# Patient Record
Sex: Female | Born: 1990
Health system: Southern US, Community
[De-identification: ages and names within clinical notes are randomized; demographics above are authoritative.]

## PROBLEM LIST (undated history)

## (undated) DIAGNOSIS — R51 Headache: Secondary | ICD-10-CM

## (undated) DIAGNOSIS — Z98891 History of uterine scar from previous surgery: Secondary | ICD-10-CM

## (undated) DIAGNOSIS — R519 Headache, unspecified: Secondary | ICD-10-CM

## (undated) DIAGNOSIS — Z789 Other specified health status: Secondary | ICD-10-CM

---
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
## 2009-05-10 DIAGNOSIS — Z98891 History of uterine scar from previous surgery: Secondary | ICD-10-CM

## 2009-05-10 HISTORY — DX: History of uterine scar from previous surgery: Z98.891

## 2013-03-06 LAB — OB RESULTS CONSOLE HIV ANTIBODY (ROUTINE TESTING): HIV: NONREACTIVE

## 2013-03-06 LAB — OB RESULTS CONSOLE RUBELLA ANTIBODY, IGM: RUBELLA: IMMUNE

## 2013-03-06 LAB — OB RESULTS CONSOLE RPR: RPR: NONREACTIVE

## 2013-03-06 LAB — OB RESULTS CONSOLE ABO/RH: RH TYPE: NEGATIVE

## 2013-03-06 LAB — OB RESULTS CONSOLE HEPATITIS B SURFACE ANTIGEN: HEP B S AG: NEGATIVE

## 2013-03-06 LAB — OB RESULTS CONSOLE ANTIBODY SCREEN: ANTIBODY SCREEN: NEGATIVE

## 2013-03-19 ENCOUNTER — Encounter: Payer: Self-pay | Admitting: Family Medicine

## 2013-08-16 ENCOUNTER — Other Ambulatory Visit: Payer: Self-pay | Admitting: Obstetrics and Gynecology

## 2013-09-07 LAB — OB RESULTS CONSOLE GC/CHLAMYDIA
Chlamydia: NEGATIVE
Gonorrhea: NEGATIVE

## 2013-09-24 ENCOUNTER — Encounter (HOSPITAL_COMMUNITY): Payer: Self-pay | Admitting: Pharmacist

## 2013-10-03 ENCOUNTER — Encounter (HOSPITAL_COMMUNITY): Payer: Self-pay

## 2013-10-04 ENCOUNTER — Encounter (HOSPITAL_COMMUNITY): Payer: Self-pay

## 2013-10-04 ENCOUNTER — Encounter (HOSPITAL_COMMUNITY)
Admission: RE | Admit: 2013-10-04 | Discharge: 2013-10-04 | Disposition: A | Discharge status: Home / Self Care | Payer: 59 | Source: Ambulatory Visit | Attending: Obstetrics and Gynecology | Admitting: Obstetrics and Gynecology

## 2013-10-04 LAB — CBC
HEMATOCRIT: 35.4 % — AB (ref 36.0–46.0)
Hemoglobin: 11.3 g/dL — ABNORMAL LOW (ref 12.0–15.0)
MCH: 27.1 pg (ref 26.0–34.0)
MCHC: 31.9 g/dL (ref 30.0–36.0)
MCV: 84.9 fL (ref 78.0–100.0)
PLATELETS: 239 10*3/uL (ref 150–400)
RBC: 4.17 MIL/uL (ref 3.87–5.11)
RDW: 16.2 % — AB (ref 11.5–15.5)
WBC: 10.6 10*3/uL — AB (ref 4.0–10.5)

## 2013-10-04 LAB — RPR

## 2013-10-04 NOTE — Patient Instructions (Signed)
   Your procedure is scheduled on:  Friday MAY 29 AT 1PM  Enter through the Main Entrance of [facility] at:1130 Pick up the phone at the desk and dial [phone] and inform us of your arrival.  Please call this number if you have any problems the morning of surgery: [phone]  Remember: Do not eat food after midnight: Do not drink clear liquids after: YOU CAN HAVE CLEAR LIQUIDS TIL 9AM Take these medicines the morning of surgery with a SIP OF WATER:  Do not wear jewelry, make-up, or FINGER nail polish No metal in your hair or on your body. Do not wear lotions, powders, perfumes.  You may wear deodorant.  Do not bring valuables to the hospital. Contacts, dentures or bridgework may not be worn into surgery.  Leave suitcase in the car. After Surgery it may be brought to your room. For patients being admitted to the hospital, checkout time is 11:00am the day of discharge.    Patients discharged on the day of surgery will not be allowed to drive home.

## 2013-10-04 NOTE — H&P (Signed)
Pamela Mcintosh, Pamela Mcintosh               ACCOUNT NO.:  [id number]  MEDICAL RECORD NO.:  [id number]  LOCATION:                                 FACILITY:  PHYSICIAN:  Lenoard Aden, M.D.DATE OF BIRTH:  November 06, 1990  DATE OF ADMISSION:  10/05/2013 DATE OF DISCHARGE:                             HISTORY & PHYSICAL   CHIEF COMPLAINT:  Repeat C-section at 39 weeks.  HISTORY OF PRESENT ILLNESS:  A 23 year old white female, G3, P1, for repeat C-section at 39 weeks.  ALLERGIES:  NONE.  MEDICATIONS:  Nexium and prenatal vitamins.  FAMILY HISTORY:  Thyroid disease, diabetes, breast cancer, hypertension, kidney disease, and rheumatoid arthritis.  SURGICAL HISTORY:  History of C-section x1.  PHYSICAL EXAMINATION:  GENERAL:  Well-developed well-nourished white female in no acute distress. HEENT:  Normal. NECK:  Supple.  Full range of motion. LUNGS:  Clear. HEART:  Regular rate and rhythm. ABDOMEN:  Soft, gravid, nontender.  Estimated fetal weight 7.5 pounds. Cervix is closed, 50%, vertex, -2. EXTREMITIES:  There are no cords. NEUROLOGIC:  Nonfocal. SKIN:  Intact.  IMPRESSION:  Term intrauterine pregnancy at 39 weeks with previous C- section, for repeat.  PLAN:  Proceed with repeat low segment transverse cesarean section. Risks of anesthesia, infection, bleeding, injury to surrounding organs, need for repair were discussed.  Delayed versus immediate complications to include bowel and bladder injury noted.  Consents were signed.  The patient desires to proceed.     Lenoard Aden, M.D.    RJT/MEDQ  D:  10/04/2013  T:  10/04/2013  Job:  [id number]

## 2013-10-05 ENCOUNTER — Encounter (HOSPITAL_COMMUNITY)
Admission: AD | Disposition: A | Discharge status: Home / Self Care | Payer: Self-pay | Source: Ambulatory Visit | Attending: Obstetrics and Gynecology

## 2013-10-05 ENCOUNTER — Inpatient Hospital Stay (HOSPITAL_COMMUNITY)
Admission: AD | Admit: 2013-10-05 | Discharge: 2013-10-07 | DRG: 766 | Disposition: A | Discharge status: Home / Self Care | Payer: 59 | Source: Ambulatory Visit | Attending: Obstetrics and Gynecology | Admitting: Obstetrics and Gynecology

## 2013-10-05 ENCOUNTER — Inpatient Hospital Stay (HOSPITAL_COMMUNITY): Payer: 59 | Admitting: Anesthesiology

## 2013-10-05 ENCOUNTER — Encounter (HOSPITAL_COMMUNITY): Payer: Self-pay | Admitting: *Deleted

## 2013-10-05 ENCOUNTER — Encounter (HOSPITAL_COMMUNITY): Payer: 59 | Admitting: Anesthesiology

## 2013-10-05 DIAGNOSIS — Z803 Family history of malignant neoplasm of breast: Secondary | ICD-10-CM | POA: Diagnosis not present

## 2013-10-05 DIAGNOSIS — O34219 Maternal care for unspecified type scar from previous cesarean delivery: Secondary | ICD-10-CM | POA: Diagnosis present

## 2013-10-05 DIAGNOSIS — Z833 Family history of diabetes mellitus: Secondary | ICD-10-CM

## 2013-10-05 HISTORY — DX: History of uterine scar from previous surgery: Z98.891

## 2013-10-05 HISTORY — DX: Other specified health status: Z78.9

## 2013-10-05 SURGERY — Surgical Case
Anesthesia: Spinal | Site: Abdomen

## 2013-10-05 MED ORDER — PHENYLEPHRINE 8 MG IN D5W 100 ML (0.08MG/ML) PREMIX OPTIME
INJECTION | INTRAVENOUS | Status: AC
  Filled 2013-10-05: qty 100

## 2013-10-05 MED ORDER — METOCLOPRAMIDE HCL 5 MG/ML IJ SOLN: 10.0000 mg | Freq: Three times a day (TID) | INTRAMUSCULAR | Status: DC | PRN

## 2013-10-05 MED ORDER — LACTATED RINGERS IV SOLN
INTRAVENOUS | Status: DC | PRN
  Administered 2013-10-05: 13:00:00 via INTRAVENOUS

## 2013-10-05 MED ORDER — 0.9 % SODIUM CHLORIDE (POUR BTL) OPTIME
TOPICAL | Status: DC | PRN
  Administered 2013-10-05: 1000 mL

## 2013-10-05 MED ORDER — IBUPROFEN 600 MG PO TABS: 600.0000 mg | ORAL_TABLET | Freq: Four times a day (QID) | ORAL | Status: DC | PRN

## 2013-10-05 MED ORDER — LACTATED RINGERS IV SOLN
INTRAVENOUS | Status: DC
  Administered 2013-10-06: via INTRAVENOUS

## 2013-10-05 MED ORDER — DIPHENHYDRAMINE HCL 25 MG PO CAPS: 25.0000 mg | ORAL_CAPSULE | Freq: Four times a day (QID) | ORAL | Status: DC | PRN

## 2013-10-05 MED ORDER — SODIUM CHLORIDE 0.9 % IJ SOLN: 3.0000 mL | INTRAMUSCULAR | Status: DC | PRN

## 2013-10-05 MED ORDER — ACETAMINOPHEN 500 MG PO TABS: 1000.0000 mg | ORAL_TABLET | Freq: Four times a day (QID) | ORAL | Status: AC

## 2013-10-05 MED ORDER — ONDANSETRON HCL 4 MG/2ML IJ SOLN
INTRAMUSCULAR | Status: DC | PRN
  Administered 2013-10-05: 4 mg via INTRAVENOUS

## 2013-10-05 MED ORDER — KETOROLAC TROMETHAMINE 30 MG/ML IJ SOLN: 30.0000 mg | Freq: Four times a day (QID) | INTRAMUSCULAR | Status: AC | PRN

## 2013-10-05 MED ORDER — OXYTOCIN 40 UNITS IN LACTATED RINGERS INFUSION - SIMPLE MED: 62.5000 mL/h | INTRAVENOUS | Status: AC

## 2013-10-05 MED ORDER — ONDANSETRON HCL 4 MG/2ML IJ SOLN
INTRAMUSCULAR | Status: AC
  Filled 2013-10-05: qty 2

## 2013-10-05 MED ORDER — IBUPROFEN 600 MG PO TABS
600.0000 mg | ORAL_TABLET | Freq: Four times a day (QID) | ORAL | Status: DC
  Administered 2013-10-05 – 2013-10-07 (×6): 600 mg via ORAL
  Filled 2013-10-05 (×6): qty 1

## 2013-10-05 MED ORDER — ZOLPIDEM TARTRATE 5 MG PO TABS: 5.0000 mg | ORAL_TABLET | Freq: Every evening | ORAL | Status: DC | PRN

## 2013-10-05 MED ORDER — KETOROLAC TROMETHAMINE 30 MG/ML IJ SOLN: 15.0000 mg | Freq: Once | INTRAMUSCULAR | Status: DC | PRN

## 2013-10-05 MED ORDER — MORPHINE SULFATE 0.5 MG/ML IJ SOLN
INTRAMUSCULAR | Status: AC
Start: 2013-10-05 — End: 2013-10-05
  Filled 2013-10-05: qty 10

## 2013-10-05 MED ORDER — ONDANSETRON HCL 4 MG/2ML IJ SOLN: 4.0000 mg | Freq: Three times a day (TID) | INTRAMUSCULAR | Status: DC | PRN

## 2013-10-05 MED ORDER — SIMETHICONE 80 MG PO CHEW
80.0000 mg | CHEWABLE_TABLET | ORAL | Status: DC
  Administered 2013-10-06 (×2): 80 mg via ORAL
  Filled 2013-10-05 (×2): qty 1

## 2013-10-05 MED ORDER — OXYCODONE-ACETAMINOPHEN 5-325 MG PO TABS
1.0000 | ORAL_TABLET | ORAL | Status: DC | PRN
  Administered 2013-10-06 – 2013-10-07 (×5): 1 via ORAL
  Filled 2013-10-05 (×5): qty 1

## 2013-10-05 MED ORDER — FENTANYL CITRATE 0.05 MG/ML IJ SOLN
INTRAMUSCULAR | Status: AC
  Filled 2013-10-05: qty 2

## 2013-10-05 MED ORDER — SIMETHICONE 80 MG PO CHEW
80.0000 mg | CHEWABLE_TABLET | Freq: Three times a day (TID) | ORAL | Status: DC
  Administered 2013-10-05 – 2013-10-07 (×4): 80 mg via ORAL
  Filled 2013-10-05 (×5): qty 1

## 2013-10-05 MED ORDER — OXYTOCIN 10 UNIT/ML IJ SOLN
INTRAMUSCULAR | Status: AC
  Filled 2013-10-05: qty 4

## 2013-10-05 MED ORDER — WITCH HAZEL-GLYCERIN EX PADS: 1.0000 "application " | MEDICATED_PAD | CUTANEOUS | Status: DC | PRN

## 2013-10-05 MED ORDER — MEPERIDINE HCL 25 MG/ML IJ SOLN: 6.2500 mg | INTRAMUSCULAR | Status: DC | PRN

## 2013-10-05 MED ORDER — METHYLERGONOVINE MALEATE 0.2 MG/ML IJ SOLN: 0.2000 mg | INTRAMUSCULAR | Status: DC | PRN

## 2013-10-05 MED ORDER — SCOPOLAMINE 1 MG/3DAYS TD PT72
1.0000 | MEDICATED_PATCH | Freq: Once | TRANSDERMAL | Status: DC
  Administered 2013-10-05: 1.5 mg via TRANSDERMAL

## 2013-10-05 MED ORDER — DIPHENHYDRAMINE HCL 50 MG/ML IJ SOLN: 25.0000 mg | INTRAMUSCULAR | Status: DC | PRN

## 2013-10-05 MED ORDER — NALBUPHINE HCL 10 MG/ML IJ SOLN: 5.0000 mg | INTRAMUSCULAR | Status: DC | PRN

## 2013-10-05 MED ORDER — SIMETHICONE 80 MG PO CHEW
80.0000 mg | CHEWABLE_TABLET | ORAL | Status: DC | PRN
  Administered 2013-10-06: 80 mg via ORAL

## 2013-10-05 MED ORDER — NALOXONE HCL 0.4 MG/ML IJ SOLN: 0.4000 mg | INTRAMUSCULAR | Status: DC | PRN

## 2013-10-05 MED ORDER — MENTHOL 3 MG MT LOZG: 1.0000 | LOZENGE | OROMUCOSAL | Status: DC | PRN

## 2013-10-05 MED ORDER — BUPIVACAINE HCL (PF) 0.25 % IJ SOLN
INTRAMUSCULAR | Status: DC | PRN
  Administered 2013-10-05: 11 mL

## 2013-10-05 MED ORDER — DIPHENHYDRAMINE HCL 50 MG/ML IJ SOLN
INTRAMUSCULAR | Status: AC
  Administered 2013-10-05: 12.5 mg via INTRAVENOUS
  Filled 2013-10-05: qty 1

## 2013-10-05 MED ORDER — ONDANSETRON HCL 4 MG PO TABS: 4.0000 mg | ORAL_TABLET | ORAL | Status: DC | PRN

## 2013-10-05 MED ORDER — SCOPOLAMINE 1 MG/3DAYS TD PT72
MEDICATED_PATCH | TRANSDERMAL | Status: AC
  Administered 2013-10-05: 1.5 mg via TRANSDERMAL
  Filled 2013-10-05: qty 1

## 2013-10-05 MED ORDER — BUPIVACAINE-EPINEPHRINE (PF) 0.25% -1:200000 IJ SOLN
INTRAMUSCULAR | Status: AC
  Filled 2013-10-05: qty 30

## 2013-10-05 MED ORDER — LACTATED RINGERS IV SOLN
40.0000 [IU] | INTRAVENOUS | Status: DC | PRN
  Administered 2013-10-05: 40 [IU] via INTRAVENOUS

## 2013-10-05 MED ORDER — DIPHENHYDRAMINE HCL 50 MG/ML IJ SOLN: 12.5000 mg | INTRAMUSCULAR | Status: DC | PRN

## 2013-10-05 MED ORDER — CEFAZOLIN SODIUM-DEXTROSE 2-3 GM-% IV SOLR
INTRAVENOUS | Status: AC
  Filled 2013-10-05: qty 50

## 2013-10-05 MED ORDER — DIBUCAINE 1 % RE OINT: 1.0000 "application " | TOPICAL_OINTMENT | RECTAL | Status: DC | PRN

## 2013-10-05 MED ORDER — FENTANYL CITRATE 0.05 MG/ML IJ SOLN: 25.0000 ug | INTRAMUSCULAR | Status: DC | PRN

## 2013-10-05 MED ORDER — MEPERIDINE HCL 25 MG/ML IJ SOLN
INTRAMUSCULAR | Status: AC
  Filled 2013-10-05: qty 1

## 2013-10-05 MED ORDER — ONDANSETRON HCL 4 MG/2ML IJ SOLN: 4.0000 mg | INTRAMUSCULAR | Status: DC | PRN

## 2013-10-05 MED ORDER — DIPHENHYDRAMINE HCL 25 MG PO CAPS: 25.0000 mg | ORAL_CAPSULE | ORAL | Status: DC | PRN

## 2013-10-05 MED ORDER — LACTATED RINGERS IV SOLN
INTRAVENOUS | Status: DC
  Administered 2013-10-05 (×3): via INTRAVENOUS

## 2013-10-05 MED ORDER — SODIUM CHLORIDE 0.9 % IJ SOLN
INTRAMUSCULAR | Status: AC
  Filled 2013-10-05: qty 20

## 2013-10-05 MED ORDER — METHYLERGONOVINE MALEATE 0.2 MG PO TABS: 0.2000 mg | ORAL_TABLET | ORAL | Status: DC | PRN

## 2013-10-05 MED ORDER — CEFAZOLIN SODIUM-DEXTROSE 2-3 GM-% IV SOLR
2.0000 g | INTRAVENOUS | Status: AC
  Administered 2013-10-05: 2 g via INTRAVENOUS

## 2013-10-05 MED ORDER — BUPIVACAINE IN DEXTROSE 0.75-8.25 % IT SOLN
INTRATHECAL | Status: DC | PRN
  Administered 2013-10-05: 1.4 mL via INTRATHECAL

## 2013-10-05 MED ORDER — DIPHENHYDRAMINE HCL 50 MG/ML IJ SOLN
12.5000 mg | INTRAMUSCULAR | Status: DC | PRN
Start: 2013-10-05 — End: 2013-10-07
  Administered 2013-10-05: 12.5 mg via INTRAVENOUS

## 2013-10-05 MED ORDER — MEPERIDINE HCL 25 MG/ML IJ SOLN
INTRAMUSCULAR | Status: DC | PRN
  Administered 2013-10-05: 12.5 mg via INTRAVENOUS

## 2013-10-05 MED ORDER — TETANUS-DIPHTH-ACELL PERTUSSIS 5-2.5-18.5 LF-MCG/0.5 IM SUSP
0.5000 mL | Freq: Once | INTRAMUSCULAR | Status: DC
Start: 2013-10-06 — End: 2013-10-07

## 2013-10-05 MED ORDER — MORPHINE SULFATE (PF) 0.5 MG/ML IJ SOLN
INTRAMUSCULAR | Status: DC | PRN
  Administered 2013-10-05: .15 mg via INTRATHECAL

## 2013-10-05 MED ORDER — FENTANYL CITRATE 0.05 MG/ML IJ SOLN
INTRAMUSCULAR | Status: DC | PRN
  Administered 2013-10-05: 25 ug via INTRATHECAL

## 2013-10-05 MED ORDER — DEXTROSE 5 % IV SOLN
1.0000 ug/kg/h | INTRAVENOUS | Status: DC | PRN
  Filled 2013-10-05: qty 2

## 2013-10-05 MED ORDER — PRENATAL MULTIVITAMIN CH
1.0000 | ORAL_TABLET | Freq: Every day | ORAL | Status: DC
  Administered 2013-10-06: 1 via ORAL
  Filled 2013-10-05: qty 1

## 2013-10-05 MED ORDER — SCOPOLAMINE 1 MG/3DAYS TD PT72
1.0000 | MEDICATED_PATCH | Freq: Once | TRANSDERMAL | Status: DC
  Filled 2013-10-05: qty 1

## 2013-10-05 MED ORDER — PROMETHAZINE HCL 25 MG/ML IJ SOLN: 6.2500 mg | INTRAMUSCULAR | Status: DC | PRN

## 2013-10-05 MED ORDER — BUPIVACAINE HCL (PF) 0.25 % IJ SOLN
INTRAMUSCULAR | Status: AC
  Filled 2013-10-05: qty 30

## 2013-10-05 MED ORDER — NALBUPHINE HCL 10 MG/ML IJ SOLN
5.0000 mg | INTRAMUSCULAR | Status: DC | PRN
  Administered 2013-10-05 – 2013-10-06 (×2): 10 mg via INTRAVENOUS
  Filled 2013-10-05 (×2): qty 1

## 2013-10-05 MED ORDER — LACTATED RINGERS IV SOLN
Freq: Once | INTRAVENOUS | Status: AC
  Administered 2013-10-05: 12:00:00 via INTRAVENOUS

## 2013-10-05 MED ORDER — NALOXONE HCL 1 MG/ML IJ SOLN
1.0000 ug/kg/h | INTRAVENOUS | Status: DC | PRN
  Filled 2013-10-05: qty 2

## 2013-10-05 MED ORDER — DIPHENHYDRAMINE HCL 25 MG PO CAPS
25.0000 mg | ORAL_CAPSULE | ORAL | Status: DC | PRN
  Filled 2013-10-05: qty 1

## 2013-10-05 MED ORDER — SENNOSIDES-DOCUSATE SODIUM 8.6-50 MG PO TABS
2.0000 | ORAL_TABLET | ORAL | Status: DC
  Administered 2013-10-06 (×2): 2 via ORAL
  Filled 2013-10-05 (×2): qty 2

## 2013-10-05 MED ORDER — LANOLIN HYDROUS EX OINT: 1.0000 "application " | TOPICAL_OINTMENT | CUTANEOUS | Status: DC | PRN

## 2013-10-05 SURGICAL SUPPLY — 36 items
BENZOIN TINCTURE PRP APPL 2/3 (GAUZE/BANDAGES/DRESSINGS) ×3 IMPLANT
CLAMP CORD UMBIL (MISCELLANEOUS) IMPLANT
CLOSURE WOUND 1/2 X4 (GAUZE/BANDAGES/DRESSINGS) ×1
CLOTH BEACON ORANGE TIMEOUT ST (SAFETY) ×3 IMPLANT
CONTAINER PREFILL 10% NBF 15ML (MISCELLANEOUS) IMPLANT
DRAPE LG THREE QUARTER DISP (DRAPES) IMPLANT
DRSG OPSITE POSTOP 4X10 (GAUZE/BANDAGES/DRESSINGS) ×3 IMPLANT
DURAPREP 26ML APPLICATOR (WOUND CARE) ×3 IMPLANT
ELECT REM PT RETURN 9FT ADLT (ELECTROSURGICAL) ×3
ELECTRODE REM PT RTRN 9FT ADLT (ELECTROSURGICAL) ×1 IMPLANT
EXTRACTOR VACUUM M CUP 4 TUBE (SUCTIONS) IMPLANT
EXTRACTOR VACUUM M CUP 4' TUBE (SUCTIONS)
GLOVE BIO SURGEON STRL SZ7.5 (GLOVE) ×3 IMPLANT
GOWN STRL REUS W/TWL LRG LVL3 (GOWN DISPOSABLE) ×6 IMPLANT
KIT ABG SYR 3ML LUER SLIP (SYRINGE) IMPLANT
NEEDLE HYPO 25X1 1.5 SAFETY (NEEDLE) ×3 IMPLANT
NEEDLE HYPO 25X5/8 SAFETYGLIDE (NEEDLE) IMPLANT
NEEDLE SPNL 20GX3.5 QUINCKE YW (NEEDLE) IMPLANT
NS IRRIG 1000ML POUR BTL (IV SOLUTION) ×3 IMPLANT
PACK C SECTION WH (CUSTOM PROCEDURE TRAY) ×3 IMPLANT
STAPLER VISISTAT 35W (STAPLE) IMPLANT
STRIP CLOSURE SKIN 1/2X4 (GAUZE/BANDAGES/DRESSINGS) ×2 IMPLANT
SUT MNCRL 0 VIOLET CTX 36 (SUTURE) ×2 IMPLANT
SUT MNCRL AB 3-0 PS2 27 (SUTURE) IMPLANT
SUT MON AB 2-0 CT1 27 (SUTURE) ×3 IMPLANT
SUT MON AB-0 CT1 36 (SUTURE) ×6 IMPLANT
SUT MONOCRYL 0 CTX 36 (SUTURE) ×4
SUT PLAIN 0 NONE (SUTURE) IMPLANT
SUT PLAIN 2 0 (SUTURE)
SUT PLAIN 2 0 XLH (SUTURE) IMPLANT
SUT PLAIN ABS 2-0 CT1 27XMFL (SUTURE) IMPLANT
SYR 20CC LL (SYRINGE) IMPLANT
SYR CONTROL 10ML LL (SYRINGE) ×3 IMPLANT
TOWEL OR 17X24 6PK STRL BLUE (TOWEL DISPOSABLE) ×3 IMPLANT
TRAY FOLEY CATH 14FR (SET/KITS/TRAYS/PACK) ×3 IMPLANT
WATER STERILE IRR 1000ML POUR (IV SOLUTION) IMPLANT

## 2013-10-05 NOTE — Anesthesia Procedure Notes (Signed)
Spinal  Patient location during procedure: OR Start time: 10/05/2013 12:45 PM Staffing Anesthesiologist: Brayton Caves Performed by: anesthesiologist  Preanesthetic Checklist Completed: patient identified, site marked, surgical consent, pre-op evaluation, timeout performed, IV checked, risks and benefits discussed and monitors and equipment checked Spinal Block Patient position: sitting Prep: DuraPrep Patient monitoring: heart rate, cardiac monitor, continuous pulse ox and blood pressure Approach: midline Location: L3-4 Injection technique: single-shot Needle Needle type: Sprotte  Needle gauge: 24 G Needle length: 9 cm Assessment Sensory level: T4 Additional Notes Patient identified.  Risk benefits discussed including failed block, incomplete pain control, headache, nerve damage, paralysis, blood pressure changes, nausea, vomiting, reactions to medication both toxic or allergic, and postpartum back pain.  Patient expressed understanding and wished to proceed.  All questions were answered.  Sterile technique used throughout procedure.  CSF was clear.  No parasthesia or other complications.  Please see nursing notes for vital signs.

## 2013-10-05 NOTE — Transfer of Care (Signed)
Immediate Anesthesia Transfer of Care Note  Patient: Pamela Mcintosh  Procedure(s) Performed: Procedure(s) with comments: Repeat CESAREAN SECTION (N/A) - EDD: 10/11/13  Patient Location: PACU  Anesthesia Type:Spinal  Level of Consciousness: awake, alert , oriented and patient cooperative  Airway & Oxygen Therapy: Patient Spontanous Breathing  Post-op Assessment: Report given to PACU RN and Post -op Vital signs reviewed and stable  Post vital signs: stable  Complications: No apparent anesthesia complications

## 2013-10-05 NOTE — Addendum Note (Signed)
Addendum created 10/05/13 1909 by Brayton Caves, MD   Modules edited: Orders, PRL Based Order Sets

## 2013-10-05 NOTE — Anesthesia Preprocedure Evaluation (Addendum)
Anesthesia Evaluation  Patient identified by MRN, date of birth, ID band Patient awake    Reviewed: Allergy & Precautions, H&P , NPO status , Patient's Chart, lab work & pertinent test results  Airway Mallampati: II       Dental   Pulmonary  breath sounds clear to auscultation        Cardiovascular Exercise Tolerance: Good Rhythm:regular Rate:Normal     Neuro/Psych    GI/Hepatic   Endo/Other    Renal/GU      Musculoskeletal   Abdominal   Peds  Hematology   Anesthesia Other Findings   Reproductive/Obstetrics (+) Pregnancy                             Anesthesia Physical Anesthesia Plan  ASA: II  Anesthesia Plan: Spinal   Post-op Pain Management:    Induction:   Airway Management Planned:   Additional Equipment:   Intra-op Plan:   Post-operative Plan:   Informed Consent: I have reviewed the patients History and Physical, chart, labs and discussed the procedure including the risks, benefits and alternatives for the proposed anesthesia with the patient or authorized representative who has indicated his/her understanding and acceptance.     Plan Discussed with: Anesthesiologist, CRNA and Surgeon  Anesthesia Plan Comments:         Anesthesia Quick Evaluation  

## 2013-10-05 NOTE — Anesthesia Postprocedure Evaluation (Signed)
  Anesthesia Post Note  Patient: Pamela Mcintosh  Procedure(s) Performed: Procedure(s) (LRB): Repeat CESAREAN SECTION (N/A)  Anesthesia type: Spinal  Patient location: PACU  Post pain: Pain level controlled  Post assessment: Post-op Vital signs reviewed  Last Vitals:  Filed Vitals:   10/05/13 1515  BP: 120/66  Pulse: 73  Temp:   Resp: 16    Post vital signs: Reviewed  Level of consciousness: awake  Complications: No apparent anesthesia complications

## 2013-10-05 NOTE — Progress Notes (Signed)
Patient ID: Pamela Mcintosh, female   DOB: 1990-11-04, 23 y.o.   MRN: [id number] Patient seen and examined. Consent witnessed and signed. No changes noted. Update completed. CBC    Component Value Date/Time   WBC 10.6* 10/04/2013 1150   RBC 4.17 10/04/2013 1150   HGB 11.3* 10/04/2013 1150   HCT 35.4* 10/04/2013 1150   PLT 239 10/04/2013 1150   MCV 84.9 10/04/2013 1150   MCH 27.1 10/04/2013 1150   MCHC 31.9 10/04/2013 1150   RDW 16.2* 10/04/2013 1150   LYMPHSABS 2.6 02/22/2008 2014   MONOABS 0.7 02/22/2008 2014   EOSABS 0.2 02/22/2008 2014   BASOSABS 0.0 02/22/2008 2014

## 2013-10-05 NOTE — Plan of Care (Signed)
Problem: Phase II Progression Outcomes Goal: Rh isoimmunization per orders Outcome: Not Applicable Date Met:  12/81/18 Rhogam studies ordered but lab will cancel as infant O- and mom A-.

## 2013-10-05 NOTE — Op Note (Signed)
Cesarean Section Procedure Note  Indications: previous uterine incision kerr x one  Pre-operative Diagnosis: 39 week 1 day pregnancy.  Post-operative Diagnosis: same  Surgeon: Lenoard Aden   Assistants: Kathi Ludwig, FACM  Anesthesia: Local anesthesia 0.25.% bupivacaine and Spinal anesthesia  ASA Class: 2  Procedure Details  The patient was seen in the Holding Room. The risks, benefits, complications, treatment options, and expected outcomes were discussed with the patient.  The patient concurred with the proposed plan, giving informed consent. The risks of anesthesia, infection, bleeding and possible injury to other organs discussed. Injury to bowel, bladder, or ureter with possible need for repair discussed. Possible need for transfusion with secondary risks of hepatitis or HIV acquisition discussed. Post operative complications to include but not limited to DVT, PE and Pneumonia noted. The site of surgery properly noted/marked. The patient was taken to Operating Room # 9, identified as Pamela Mcintosh and the procedure verified as C-Section Delivery. A Time Out was held and the above information confirmed.  After induction of anesthesia, the patient was draped and prepped in the usual sterile manner. A Pfannenstiel incision was made and carried down through the subcutaneous tissue to the fascia. Fascial incision was made and extended transversely using Mayo scissors. The fascia was separated from the underlying rectus tissue superiorly and inferiorly. The peritoneum was identified and entered. Peritoneal incision was extended longitudinally. The utero-vesical peritoneal reflection was incised transversely and the bladder flap was bluntly freed from the lower uterine segment. A low transverse uterine incision(Kerr hysterotomy) was made. Delivered from OT presentation was a  female with Apgar scores of 9 at one minute and 9 at five minutes. Bulb suctioning gently performed. Neonatal team in  attendance.After the umbilical cord was clamped and cut cord blood was obtained for evaluation. The placenta was removed intact and appeared normal. The uterus was curetted with a dry lap pack. Good hemostasis was noted.The uterine outline, tubes and ovaries appeared normal. The uterine incision was closed with running locked sutures of 0 Monocryl x 2 layers. Hemostasis was observed. Lavage was carried out until clear.The parietal peritoneum was closed with a running 2-0 Monocryl suture. The fascia was then reapproximated with running sutures of 0 Monocryl. The skin was reapproximated with 3-0 monocryl after Milo closure with 2-0 plain.  Instrument, sponge, and needle counts were correct prior the abdominal closure and at the conclusion of the case.   Findings: Above noted  Estimated Blood Loss:  300 mL         Drains: foley                 Specimens:placenta                 Complications:  None; patient tolerated the procedure well.         Disposition: PACU - hemodynamically stable.         Condition: stable  Attending Attestation: I performed the procedure.

## 2013-10-06 ENCOUNTER — Encounter (HOSPITAL_COMMUNITY): Payer: Self-pay | Admitting: Obstetrics and Gynecology

## 2013-10-06 LAB — CBC
HCT: 31.3 % — ABNORMAL LOW (ref 36.0–46.0)
Hemoglobin: 10.1 g/dL — ABNORMAL LOW (ref 12.0–15.0)
MCH: 27.5 pg (ref 26.0–34.0)
MCHC: 32.3 g/dL (ref 30.0–36.0)
MCV: 85.3 fL (ref 78.0–100.0)
PLATELETS: 196 10*3/uL (ref 150–400)
RBC: 3.67 MIL/uL — AB (ref 3.87–5.11)
RDW: 16.4 % — AB (ref 11.5–15.5)
WBC: 9.6 10*3/uL (ref 4.0–10.5)

## 2013-10-06 NOTE — Anesthesia Postprocedure Evaluation (Signed)
  Anesthesia Post-op Note  Patient: Pamela Mcintosh  Procedure(s) Performed: Procedure(s) with comments: Repeat CESAREAN SECTION (N/A) - EDD: 10/11/13  Patient Location: Mother/Baby  Anesthesia Type:Spinal  Level of Consciousness: awake, alert  and oriented  Airway and Oxygen Therapy: Patient Spontanous Breathing  Post-op Pain: mild  Post-op Assessment: Post-op Vital signs reviewed, Patient's Cardiovascular Status Stable, Patent Airway, No signs of Nausea or vomiting, Adequate PO intake, No headache, No backache, No residual numbness and No residual motor weakness  Post-op Vital Signs: stable  Last Vitals:  Filed Vitals:   10/06/13 0515  BP: 112/59  Pulse: 76  Temp: 36.9 C  Resp: 18    Complications: No apparent anesthesia complications

## 2013-10-06 NOTE — Addendum Note (Signed)
Addendum created 10/06/13 0825 by Lincoln Brigham, CRNA   Modules edited: Charges VN, Notes Section   Notes Section:  File: [id number]

## 2013-10-06 NOTE — Progress Notes (Addendum)
Patient ID: Pamela Mcintosh, female   DOB: [birth year], 23 y.o.   MRN: [id number] Subjective: POD# 1 Information for the patient's newborn:  Pamela Mcintosh, Pamela Mcintosh [[id number]]  female   Reports feeling well and ready for an early discharge tomorrow Feeding: bottle - formula Patient reports tolerating PO.  Breast symptoms: none - not breastfeeding Pain controlled with ibuprofen (OTC) and narcotic analgesics including Percocet Denies HA/SOB/C/P/N/V/dizziness. Flatus none. She reports vaginal bleeding as normal, without clots.  She is ambulating, urinating without difficult.     Objective:   VS:  Filed Vitals:   10/05/13 2255 10/06/13 0105 10/06/13 0515 10/06/13 0846  BP: 115/56 117/53 112/59 112/61  Pulse: 75 56 76 70  Temp:  98.4 F (36.9 C) 98.5 F (36.9 C) 97.9 F (36.6 C)  TempSrc: Axillary Axillary Oral Oral  Resp: 20 18 18    Weight:      SpO2: 97% 96% 96% 98%     Intake/Output Summary (Last 24 hours) at 10/06/13 1111 Last data filed at 10/06/13 2563  Gross per 24 hour  Intake 5824.83 ml  Output   4500 ml  Net 1324.83 ml        Recent Labs  10/04/13 1150 10/06/13 0648  WBC 10.6* 9.6  HGB 11.3* 10.1*  HCT 35.4* 31.3*  PLT 239 196     Blood type: A NEG (05/28 1150)  Rubella: Immune (10/28 0000)     Physical Exam:  General: alert, cooperative and no distress CV: Regular rate and rhythm, S1S2 present or without murmur or extra heart sounds Resp: clear Abdomen: soft, nontender, normal bowel sounds Incision: Tegaderm and Honeycomb dressings intact with small bloody drainage present - borders marked Uterine Fundus: firm, 1 FB below umbilicus, nontender Lochia: minimal Ext: extremities normal, atraumatic, no cyanosis or edema, Homans sign is negative, no sign of DVT and no edema, redness or tenderness in the calves or thighs / SCD in place    Assessment/Plan: 23 y.o.   POD# 1.  s/p Cesarean Delivery.  Indications: Repeat                 Doing well, stable.                Advance diet as tolerated D/C foley  Ambulate Routine post-op care  Kenard Gower, MSN, CNM 10/06/2013, 11:11 AM

## 2013-10-07 LAB — TYPE AND SCREEN
ABO/RH(D): A NEG
ANTIBODY SCREEN: POSITIVE
DAT, IGG: NEGATIVE
UNIT DIVISION: 0
UNIT DIVISION: 0
Unit division: 0

## 2013-10-07 MED ORDER — OXYCODONE-ACETAMINOPHEN 5-325 MG PO TABS: 1.0000 | ORAL_TABLET | ORAL | Status: DC | PRN

## 2013-10-07 MED ORDER — IBUPROFEN 600 MG PO TABS: 600.0000 mg | ORAL_TABLET | Freq: Four times a day (QID) | ORAL | Status: DC | PRN

## 2013-10-07 NOTE — Progress Notes (Signed)
Patient ID: [name], female   DOB: 1990/10/17, 23 y.o.   MRN: [id number] Subjective: POD# 2 Information for the patient's newborn:  Deasya, Heis Girl Monseratt [[id number]]  female    Reports feeling well and ready for an early discharge today Feeding: bottle - formula Patient reports tolerating PO.  Breast symptoms: none - not breastfeeding Pain controlled with ibuprofen (OTC) and narcotic analgesics including Percocet Denies HA/SOB/C/P/N/V/dizziness. Flatus present. She reports vaginal bleeding as normal, without clots.  She is ambulating, urinating without difficult.     Objective:   VS:  Filed Vitals:   10/06/13 0846 10/06/13 1252 10/06/13 1741 10/07/13 0602  BP: 112/61 111/49 109/72 131/68  Pulse: 70 73 75 81  Temp: 97.9 F (36.6 C) 98.7 F (37.1 C) 97 F (36.1 C) 98 F (36.7 C)  TempSrc: Oral Oral Oral Oral  Resp:  16 18 18   Weight:      SpO2: 98% 98%  98%      Intake/Output Summary (Last 24 hours) at 10/07/13 0711 Last data filed at 10/06/13 1135  Gross per 24 hour  Intake 1708.33 ml  Output    800 ml  Net 908.33 ml         Recent Labs  10/04/13 1150 10/06/13 0648  WBC 10.6* 9.6  HGB 11.3* 10.1*  HCT 35.4* 31.3*  PLT 239 196     Blood type: A NEG (05/28 1150)  Rubella: Immune (10/28 0000)     Physical Exam:  General: alert, cooperative and no distress CV: Regular rate and rhythm, S1S2 present or without murmur or extra heart sounds Resp: clear Abdomen: soft, nontender, normal bowel sounds Incision: Tegaderm and Honeycomb dressings intact with small bloody drainage present - borders marked Uterine Fundus: firm, 2 FB below umbilicus, nontender Lochia: minimal Ext: extremities normal, atraumatic, no cyanosis or edema, Homans sign is negative, no sign of DVT and no edema, redness or tenderness in the calves or thighs    Assessment/Plan: 23 y.o.   POD# 2.  s/p Cesarean Delivery.  Indications: Repeat                 Doing well, stable.        Regular diet as tolerated Ambulate Routine post-op care Early discharge today  Kenard Gower, MSN, CNM 10/07/2013, 7:11 AM

## 2013-10-07 NOTE — Discharge Summary (Signed)
POSTOPERATIVE DISCHARGE SUMMARY:  Patient ID: Pamela Mcintosh MRN: [id number] DOB/AGE: 24/20/1992 22 y.o.  Admit date: 10/05/2013 Admission Diagnoses: Repeat cesarean delivery @ term   Discharge date:  10/07/2013 Discharge Diagnoses: S/P Repeat C/S on 08/05/2013        Prenatal history: [id number]   EDC : 10/11/2013, by Last Menstrual Period  Has received prenatal care at [facility] since 8.[redacted] wks gestation. Primary provider : Dr. Billy Coast Prenatal course complicated by abnormal glucose and breech presentation  Prenatal Labs: ABO, Rh: A NEG (05/28 1150) / Rhophylac @ 28 wks on 07/24/2013 / not required PP - infant O NEG Antibody: POS (05/28 1150) Rubella: Immune (10/28 0000)   RPR: NON REAC (05/28 1150)  HBsAg: Negative (10/28 0000)  HIV: Non-reactive (10/28 0000)  GTT : abnormal 1 hr GTT, normal 3 hr GTT GBS:  Negative   Medical / Surgical History :  Past medical history:  Past Medical History  Diagnosis Date  . Medical history non-contributory   . H/O cesarean section 2011  . Postpartum care following cesarean delivery (5/29) 10/06/2013    Past surgical history:  Past Surgical History  Procedure Laterality Date  . Cesarean section      2011     Allergies: Morphine and related   Intrapartum Course:  Admitted for repeat cesarean delivery by Dr. Billy Coast  Physical Exam:   VSS: Blood pressure 131/68, pulse 81, temperature 98 F (36.7 C), temperature source Oral, resp. rate 18, weight 79.4 kg (175 lb 0.7 oz), last menstrual period 01/04/2013, SpO2 98.00%, unknown if currently breastfeeding.  LABS:  Recent Labs  10/04/13 1150 10/06/13 0648  WBC 10.6* 9.6  HGB 11.3* 10.1*  PLT 239 196    Newborn Data Live born female on 10/05/2013 Birth Weight: 6 lb 1.7 oz (2770 g) APGAR: ,   See operative report for further details  Home with mother.  Discharge Instructions:  Wound Care: keep clean and dry / remove honeycomb POD 7 Postpartum Instructions:  [facility] discharge booklet - instructions reviewed Medications:    Medication List         acetaminophen 500 MG tablet  Commonly known as:  TYLENOL  Take 1,000 mg by mouth every 6 (six) hours as needed for headache.     ibuprofen 600 MG tablet  Commonly known as:  ADVIL,MOTRIN  Take 1 tablet (600 mg total) by mouth every 6 (six) hours as needed for mild pain.     multivitamin-prenatal 27-0.8 MG Tabs tablet  Take 1 tablet by mouth daily at 12 noon.     oxyCODONE-acetaminophen 5-325 MG per tablet  Commonly known as:  PERCOCET/ROXICET  Take 1-2 tablets by mouth every 4 (four) hours as needed for severe pain (moderate - severe pain).     ranitidine 150 MG tablet  Commonly known as:  ZANTAC  Take 150 mg by mouth at bedtime as needed for heartburn.           Follow-up Information   Follow up with Lenoard Aden, MD In 6 weeks. (For Postpartum visit)    Specialty:  Obstetrics and Gynecology   Contact information:   [address] [place] [address] [phone]         Signed: Kenard Gower, MSN, CNM 10/07/2013, 7:28 AM

## 2013-10-07 NOTE — Discharge Instructions (Signed)
Cesarean Delivery °Care After °Refer to this sheet in the next few weeks. These instructions provide you with information on caring for yourself after your procedure. Your health care provider may also give you specific instructions. Your treatment has been planned according to current medical practices, but problems sometimes occur. Call your health care provider if you have any problems or questions after you go home. °HOME CARE INSTRUCTIONS  °· Only take over-the-counter or prescription medications as directed by your health care provider. °· Do not drink alcohol, especially if you are breastfeeding or taking medication to relieve pain. °· Do not chew or smoke tobacco. °· Continue to use good perineal care. Good perineal care includes: °· Wiping your perineum from front to back. °· Keeping your perineum clean. °· Check your surgical cut (incision) daily for increased redness, drainage, swelling, or separation of skin. °· Clean your incision gently with soap and water every day, and then pat it dry. If your health care provider says it is OK, leave the incision uncovered. Use a bandage (dressing) if the incision is draining fluid or appears irritated. If the adhesive strips across the incision do not fall off within 7 days, carefully peel them off. °· Hug a pillow when coughing or sneezing until your incision is healed. This helps to relieve pain. °· Do not use tampons or douche until your health care provider says it is okay. °· Shower, wash your hair, and take tub baths as directed by your health care provider. °· Wear a well-fitting bra that provides breast support. °· Limit wearing support panties or control-top hose. °· Drink enough fluids to keep your urine clear or pale yellow. °· Eat high-fiber foods such as whole grain cereals and breads, brown rice, beans, and fresh fruits and vegetables every day. These foods may help prevent or relieve constipation. °· Resume activities such as climbing stairs,  driving, lifting, exercising, or traveling as directed by your health care provider. °· Talk to your health care provider about resuming sexual activities. This is dependent upon your risk of infection, your rate of healing, and your comfort and desire to resume sexual activity. °· Try to have someone help you with your household activities and your newborn for at least a few days after you leave the hospital. °· Rest as much as possible. Try to rest or take a nap when your newborn is sleeping. °· Increase your activities gradually. °· Keep all of your scheduled postpartum appointments. It is very important to keep your scheduled follow-up appointments. At these appointments, your health care provider will be checking to make sure that you are healing physically and emotionally. °SEEK MEDICAL CARE IF:  °· You are passing large clots from your vagina. Save any clots to show your health care provider. °· You have a foul smelling discharge from your vagina. °· You have trouble urinating. °· You are urinating frequently. °· You have pain when you urinate. °· You have a change in your bowel movements. °· You have increasing redness, pain, or swelling near your incision. °· You have pus draining from your incision. °· Your incision is separating. °· You have painful, hard, or reddened breasts. °· You have a severe headache. °· You have blurred vision or see spots. °· You feel sad or depressed. °· You have thoughts of hurting yourself or your newborn. °· You have questions about your care, the care of your newborn, or medications. °· You are dizzy or lightheaded. °· You have a rash. °· You   have pain, redness, or swelling at the site of the removed intravenous access (IV) tube. °· You have nausea or vomiting. °· You stopped breastfeeding and have not had a menstrual period within 12 weeks of stopping. °· You are not breastfeeding and have not had a menstrual period within 12 weeks of delivery. °· You have a fever. °SEEK  IMMEDIATE MEDICAL CARE IF: °· You have persistent pain. °· You have chest pain. °· You have shortness of breath. °· You faint. °· You have leg pain. °· You have stomach pain. °· Your vaginal bleeding saturates 2 or more sanitary pads in 1 hour. °MAKE SURE YOU:  °· Understand these instructions. °· Will watch your condition. °· Will get help right away if you are not doing well or get worse. °Document Released: 01/16/2002 Document Revised: 12/27/2012 Document Reviewed: 12/22/2011 °ExitCare® Patient Information ©2014 [employer]. °Postpartum Depression and Baby Blues °The postpartum period begins right after the birth of a baby. During this time, there is often a great amount of joy and excitement. It is also a time of considerable changes in the life of the parent(s). Regardless of how many times a mother gives birth, each child brings new challenges and dynamics to the family. It is not unusual to have feelings of excitement accompanied by confusing shifts in moods, emotions, and thoughts. All mothers are at risk of developing postpartum depression or the "baby blues." These mood changes can occur right after giving birth, or they may occur many months after giving birth. The baby blues or postpartum depression can be mild or severe. Additionally, postpartum depression can resolve rather quickly, or it can be a long-term condition. °CAUSES °Elevated hormones and their rapid decline are thought to be a main cause of postpartum depression and the baby blues. There are a number of hormones that radically change during and after pregnancy. Estrogen and progesterone usually decrease immediately after delivering your baby. The level of thyroid hormone and various cortisol steroids also rapidly drop. Other factors that play a major role in these changes include major life events and genetics.  °RISK FACTORS °If you have any of the following risks for the baby blues or postpartum depression, know what symptoms to watch  out for during the postpartum period. Risk factors that may increase the likelihood of getting the baby blues or postpartum depression include: °· Having a personal or family history of depression. °· Having depression while being pregnant. °· Having premenstrual or oral contraceptive-associated mood issues. °· Having exceptional life stress. °· Having marital conflict. °· Lacking a social support network. °· Having a baby with special needs. °· Having health problems such as diabetes. °SYMPTOMS °Baby blues symptoms include: °· Brief fluctuations in mood, such as going from extreme happiness to sadness. °· Decreased concentration. °· Difficulty sleeping. °· Crying spells, tearfulness. °· Irritability. °· Anxiety. °Postpartum depression symptoms typically begin within the first month after giving birth. These symptoms include: °· Difficulty sleeping or excessive sleepiness. °· Marked weight loss. °· Agitation. °· Feelings of worthlessness. °· Lack of interest in activity or food. °Postpartum psychosis is a very concerning condition and can be dangerous. Fortunately, it is rare. Displaying any of the following symptoms is cause for immediate medical attention. Postpartum psychosis symptoms include: °· Hallucinations and delusions. °· Bizarre or disorganized behavior. °· Confusion or disorientation. °DIAGNOSIS  °A diagnosis is made by an evaluation of your symptoms. There are no medical or lab tests that lead to a diagnosis, but there are various questionnaires that   a caregiver may use to identify those with the baby blues, postpartum depression, or psychosis. Often times, a screening tool called the Edinburgh Postnatal Depression Scale is used to diagnose depression in the postpartum period.  °TREATMENT °The baby blues usually goes away on its own in 1 to 2 weeks. Social support is often all that is needed. You should be encouraged to get adequate sleep and rest. Occasionally, you may be given medicines to help you  sleep.  °Postpartum depression requires treatment as it can last several months or longer if it is not treated. Treatment may include individual or group therapy, medicine, or both to address any social, physiological, and psychological factors that may play a role in the depression. Regular exercise, a healthy diet, rest, and social support may also be strongly recommended.  °Postpartum psychosis is more serious and needs treatment right away. Hospitalization is often needed. °HOME CARE INSTRUCTIONS °· Get as much rest as you can. Nap when the baby sleeps. °· Exercise regularly. Some women find yoga and walking to be beneficial. °· Eat a balanced and nourishing diet. °· Do little things that you enjoy. Have a cup of tea, take a bubble bath, read your favorite magazine, or listen to your favorite music. °· Avoid alcohol. °· Ask for help with household chores, cooking, grocery shopping, or running errands as needed. Do not try to do everything. °· Talk to people close to you about how you are feeling. Get support from your partner, family members, friends, or other new moms. °· Try to stay positive in how you think. Think about the things you are grateful for. °· Do not spend a lot of time alone. °· Only take medicine as directed by your caregiver. °· Keep all your postpartum appointments. °· Let your caregiver know if you have any concerns. °SEEK MEDICAL CARE IF: °You are having a reaction or problems with your medicine. °SEEK IMMEDIATE MEDICAL CARE IF: °· You have suicidal feelings. °· You feel you may harm the baby or someone else. °Document Released: 01/29/2004 Document Revised: 07/19/2011 Document Reviewed: 02/05/2013 °ExitCare® Patient Information ©2014 [employer]. ° °

## 2013-10-08 ENCOUNTER — Encounter (HOSPITAL_COMMUNITY): Payer: Self-pay | Admitting: Obstetrics and Gynecology

## 2013-10-08 NOTE — Progress Notes (Signed)
Ur chart review completed per request.  

## 2014-03-11 ENCOUNTER — Encounter (HOSPITAL_COMMUNITY): Payer: Self-pay | Admitting: Obstetrics and Gynecology

## 2014-04-17 ENCOUNTER — Encounter (HOSPITAL_COMMUNITY): Payer: Self-pay | Admitting: Obstetrics and Gynecology

## 2014-05-09 LAB — OB RESULTS CONSOLE ABO/RH: RH Type: NEGATIVE

## 2014-05-09 LAB — OB RESULTS CONSOLE HEPATITIS B SURFACE ANTIGEN: Hepatitis B Surface Ag: NEGATIVE

## 2014-05-09 LAB — OB RESULTS CONSOLE RUBELLA ANTIBODY, IGM: Rubella: IMMUNE

## 2014-05-09 LAB — OB RESULTS CONSOLE RPR: RPR: NONREACTIVE

## 2014-05-09 LAB — OB RESULTS CONSOLE ANTIBODY SCREEN: ANTIBODY SCREEN: NEGATIVE

## 2014-05-09 LAB — OB RESULTS CONSOLE HIV ANTIBODY (ROUTINE TESTING): HIV: NONREACTIVE

## 2014-10-22 ENCOUNTER — Other Ambulatory Visit: Payer: Self-pay | Admitting: Obstetrics and Gynecology

## 2014-12-05 ENCOUNTER — Encounter (HOSPITAL_COMMUNITY): Payer: Self-pay

## 2014-12-06 ENCOUNTER — Encounter (HOSPITAL_COMMUNITY)
Admission: RE | Admit: 2014-12-06 | Discharge: 2014-12-06 | Disposition: A | Discharge status: Home / Self Care | Payer: 59 | Source: Ambulatory Visit | Attending: Obstetrics and Gynecology | Admitting: Obstetrics and Gynecology

## 2014-12-06 ENCOUNTER — Encounter (HOSPITAL_COMMUNITY): Payer: Self-pay

## 2014-12-06 HISTORY — DX: Headache: R51

## 2014-12-06 HISTORY — DX: Headache, unspecified: R51.9

## 2014-12-06 LAB — CBC
HEMATOCRIT: 35.9 % — AB (ref 36.0–46.0)
Hemoglobin: 11.8 g/dL — ABNORMAL LOW (ref 12.0–15.0)
MCH: 28.6 pg (ref 26.0–34.0)
MCHC: 32.9 g/dL (ref 30.0–36.0)
MCV: 86.9 fL (ref 78.0–100.0)
Platelets: 200 10*3/uL (ref 150–400)
RBC: 4.13 MIL/uL (ref 3.87–5.11)
RDW: 15.1 % (ref 11.5–15.5)
WBC: 9.4 10*3/uL (ref 4.0–10.5)

## 2014-12-06 NOTE — Patient Instructions (Signed)
Your procedure is scheduled on:12/09/14  Enter through the Main Entrance at :6am Pick up desk phone and dial [phone] and inform us of your arrival.  Please call [phone] if you have any problems the morning of surgery.  Remember: Do not eat food or drink liquids after midnight:Sunday Water is ok until:3:30 am on Monday   You may brush your teeth the morning of surgery.  Take these meds the morning of surgery with a sip of water:Zantac if needed  DO NOT wear jewelry, eye make-up, lipstick,body lotion, or dark fingernail polish.  (Polished toes are ok) You may wear deodorant.  If you are to be admitted after surgery, leave suitcase in car until your room has been assigned.  Wear loose fitting, comfortable clothes for your ride home.

## 2014-12-07 LAB — RPR: RPR Ser Ql: NONREACTIVE

## 2014-12-08 ENCOUNTER — Encounter (HOSPITAL_COMMUNITY): Payer: Self-pay | Admitting: Anesthesiology

## 2014-12-08 NOTE — Anesthesia Preprocedure Evaluation (Addendum)
Anesthesia Evaluation  Patient identified by MRN, date of birth, ID band Patient awake    Reviewed: Allergy & Precautions, NPO status , Patient's Chart, lab work & pertinent test results  Airway Mallampati: III  TM Distance: >3 FB Neck ROM: Full    Dental no notable dental hx. 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   Abdominal (+) + obese,   Peds  Hematology  (+) anemia ,   Anesthesia Other Findings Permanent lower retainer  Reproductive/Obstetrics (+) Pregnancy Previous C/Section                            Anesthesia Physical Anesthesia Plan  ASA: II  Anesthesia Plan: Spinal   Post-op Pain Management:    Induction:   Airway Management Planned: Natural Airway  Additional Equipment:   Intra-op Plan:   Post-operative Plan:   Informed Consent: I have reviewed the patients History and Physical, chart, labs and discussed the procedure including the risks, benefits and alternatives for the proposed anesthesia with the patient or authorized representative who has indicated his/her understanding and acceptance.     Plan Discussed with: Anesthesiologist, CRNA and Surgeon  Anesthesia Plan Comments:         Anesthesia Quick Evaluation

## 2014-12-08 NOTE — H&P (Signed)
Pamela Mcintosh is a 24 y.o. female presenting for rpt csection and TL.  Maternal Medical History:  Contractions: Onset was less than 1 hour ago.   Frequency: rare.   Perceived severity is mild.    Fetal activity: Perceived fetal activity is normal.   Last perceived fetal movement was within the past hour.    Prenatal complications: Oligohydramnios.   Prenatal Complications - Diabetes: none.    OB History    Gravida Para Term Preterm AB TAB SAB Ectopic Multiple Living   Past Medical History  Diagnosis Date  . H/O cesarean section 2011  . Postpartum care following cesarean delivery (5/29) 10/06/2013  . Medical history non-contributory   . Headache     rarely   Past Surgical History  Procedure Laterality Date  . Cesarean section      2011  . Cesarean section N/A 10/05/2013    Procedure: Repeat CESAREAN SECTION;  Surgeon: Lenoard Aden, MD;  Location: [facility];  Service: Obstetrics;  Laterality: N/A;  EDD: 10/11/13   Family History: family history is not on file. Social History:  reports that she has never smoked. She has never used smokeless tobacco. She reports that she does not drink alcohol or use illicit drugs.   Prenatal Transfer Tool  Maternal Diabetes: No Genetic Screening: Normal Maternal Ultrasounds/Referrals: Normal Fetal Ultrasounds or other Referrals:  None Maternal Substance Abuse:  No Significant Maternal Medications:  Meds include: Nexium Significant Maternal Lab Results:  Lab values include: Group B Strep positive Other Comments:  None  Review of Systems  Constitutional: Negative.   All other systems reviewed and are negative.     Last menstrual period 03/02/2014, unknown if currently breastfeeding. Maternal Exam:  Uterine Assessment: Contraction strength is mild.  Contraction frequency is rare.   Abdomen: Patient reports no abdominal tenderness. Surgical scars: low transverse.   Fetal presentation: vertex  Introitus: Normal  vulva. Normal vagina.  Ferning test: not done.  Nitrazine test: not done. Amniotic fluid character: not assessed.  Pelvis: questionable for delivery.   Cervix: Cervix evaluated by digital exam.     Physical Exam  Nursing note and vitals reviewed. Constitutional: She is oriented to person, place, and time. She appears well-developed and well-nourished.  HENT:  Head: Normocephalic and atraumatic.  Neck: Normal range of motion. Neck supple.  Cardiovascular: Normal rate and regular rhythm.   Respiratory: Effort normal and breath sounds normal.  GI: Soft. Bowel sounds are normal.  Genitourinary: Vagina normal and uterus normal.  Musculoskeletal: Normal range of motion.  Neurological: She is alert and oriented to person, place, and time. She has normal reflexes.  Skin: Skin is warm and dry.  Psychiatric: She has a normal mood and affect.    Prenatal labs: ABO, Rh: --/--/A NEG (07/29 1005) Antibody: POS (07/29 1005) Rubella: Immune (12/31 0000) RPR: Non Reactive (07/29 1005)  HBsAg: Negative (12/31 0000)  HIV: Non-reactive (12/31 0000)  GBS:     Assessment/Plan: 39 weeks IUP Previous csection x 2 for rpt For TL Risks vs benefits discussed Consent done   Pamela Mcintosh J 12/08/2014, 7:53 PM

## 2014-12-09 ENCOUNTER — Encounter (HOSPITAL_COMMUNITY): Payer: Self-pay

## 2014-12-09 ENCOUNTER — Inpatient Hospital Stay (HOSPITAL_COMMUNITY): Payer: 59 | Admitting: Anesthesiology

## 2014-12-09 ENCOUNTER — Inpatient Hospital Stay (HOSPITAL_COMMUNITY): Admission: AD | Admit: 2014-12-09 | Payer: 59 | Source: Ambulatory Visit | Admitting: Obstetrics and Gynecology

## 2014-12-09 ENCOUNTER — Inpatient Hospital Stay (HOSPITAL_COMMUNITY)
Admission: RE | Admit: 2014-12-09 | Discharge: 2014-12-11 | DRG: 765 | Disposition: A | Discharge status: Home / Self Care | Payer: 59 | Source: Ambulatory Visit | Attending: Obstetrics and Gynecology | Admitting: Obstetrics and Gynecology

## 2014-12-09 ENCOUNTER — Encounter (HOSPITAL_COMMUNITY)
Admission: RE | Disposition: A | Discharge status: Home / Self Care | Payer: Self-pay | Source: Ambulatory Visit | Attending: Obstetrics and Gynecology

## 2014-12-09 DIAGNOSIS — L259 Unspecified contact dermatitis, unspecified cause: Secondary | ICD-10-CM | POA: Diagnosis not present

## 2014-12-09 DIAGNOSIS — Z3A39 39 weeks gestation of pregnancy: Secondary | ICD-10-CM | POA: Diagnosis present

## 2014-12-09 DIAGNOSIS — E669 Obesity, unspecified: Secondary | ICD-10-CM | POA: Diagnosis present

## 2014-12-09 DIAGNOSIS — O99214 Obesity complicating childbirth: Secondary | ICD-10-CM | POA: Diagnosis present

## 2014-12-09 DIAGNOSIS — O99824 Streptococcus B carrier state complicating childbirth: Secondary | ICD-10-CM | POA: Diagnosis present

## 2014-12-09 DIAGNOSIS — Z6791 Unspecified blood type, Rh negative: Secondary | ICD-10-CM

## 2014-12-09 DIAGNOSIS — Z302 Encounter for sterilization: Secondary | ICD-10-CM

## 2014-12-09 DIAGNOSIS — O4103X0 Oligohydramnios, third trimester, not applicable or unspecified: Secondary | ICD-10-CM | POA: Diagnosis present

## 2014-12-09 DIAGNOSIS — O26893 Other specified pregnancy related conditions, third trimester: Secondary | ICD-10-CM | POA: Diagnosis present

## 2014-12-09 DIAGNOSIS — Z6831 Body mass index (BMI) 31.0-31.9, adult: Secondary | ICD-10-CM

## 2014-12-09 DIAGNOSIS — K219 Gastro-esophageal reflux disease without esophagitis: Secondary | ICD-10-CM | POA: Diagnosis present

## 2014-12-09 DIAGNOSIS — O3421 Maternal care for scar from previous cesarean delivery: Secondary | ICD-10-CM | POA: Diagnosis present

## 2014-12-09 DIAGNOSIS — O9962 Diseases of the digestive system complicating childbirth: Secondary | ICD-10-CM | POA: Diagnosis present

## 2014-12-09 SURGERY — Surgical Case
Anesthesia: Spinal | Site: Abdomen | Laterality: Bilateral

## 2014-12-09 MED ORDER — SIMETHICONE 80 MG PO CHEW
80.0000 mg | CHEWABLE_TABLET | ORAL | Status: DC
  Administered 2014-12-10 (×2): 80 mg via ORAL
  Filled 2014-12-09 (×2): qty 1

## 2014-12-09 MED ORDER — OXYTOCIN 40 UNITS IN LACTATED RINGERS INFUSION - SIMPLE MED: 62.5000 mL/h | INTRAVENOUS | Status: AC

## 2014-12-09 MED ORDER — BUPIVACAINE HCL (PF) 0.25 % IJ SOLN
INTRAMUSCULAR | Status: DC | PRN
  Administered 2014-12-09: 20 mL

## 2014-12-09 MED ORDER — MORPHINE SULFATE 0.5 MG/ML IJ SOLN
INTRAMUSCULAR | Status: AC
  Filled 2014-12-09: qty 10

## 2014-12-09 MED ORDER — OXYCODONE-ACETAMINOPHEN 5-325 MG PO TABS: 2.0000 | ORAL_TABLET | ORAL | Status: DC | PRN

## 2014-12-09 MED ORDER — MENTHOL 3 MG MT LOZG: 1.0000 | LOZENGE | OROMUCOSAL | Status: DC | PRN

## 2014-12-09 MED ORDER — SIMETHICONE 80 MG PO CHEW
80.0000 mg | CHEWABLE_TABLET | Freq: Three times a day (TID) | ORAL | Status: DC
  Administered 2014-12-09 – 2014-12-11 (×4): 80 mg via ORAL
  Filled 2014-12-09 (×4): qty 1

## 2014-12-09 MED ORDER — MEPERIDINE HCL 25 MG/ML IJ SOLN: 6.2500 mg | INTRAMUSCULAR | Status: DC | PRN

## 2014-12-09 MED ORDER — METHYLERGONOVINE MALEATE 0.2 MG/ML IJ SOLN: 0.2000 mg | INTRAMUSCULAR | Status: DC | PRN

## 2014-12-09 MED ORDER — KETOROLAC TROMETHAMINE 30 MG/ML IJ SOLN
30.0000 mg | Freq: Four times a day (QID) | INTRAMUSCULAR | Status: AC | PRN
  Administered 2014-12-09: 30 mg via INTRAMUSCULAR

## 2014-12-09 MED ORDER — DIBUCAINE 1 % RE OINT: 1.0000 "application " | TOPICAL_OINTMENT | RECTAL | Status: DC | PRN

## 2014-12-09 MED ORDER — PHENYLEPHRINE 8 MG IN D5W 100 ML (0.08MG/ML) PREMIX OPTIME
INJECTION | INTRAVENOUS | Status: DC | PRN
  Administered 2014-12-09: 60 ug/min via INTRAVENOUS

## 2014-12-09 MED ORDER — KETOROLAC TROMETHAMINE 30 MG/ML IJ SOLN
INTRAMUSCULAR | Status: AC
  Filled 2014-12-09: qty 1

## 2014-12-09 MED ORDER — PHENYLEPHRINE 8 MG IN D5W 100 ML (0.08MG/ML) PREMIX OPTIME
INJECTION | INTRAVENOUS | Status: AC
  Filled 2014-12-09: qty 100

## 2014-12-09 MED ORDER — ACETAMINOPHEN 325 MG PO TABS: 650.0000 mg | ORAL_TABLET | ORAL | Status: DC | PRN

## 2014-12-09 MED ORDER — MORPHINE SULFATE (PF) 0.5 MG/ML IJ SOLN
INTRAMUSCULAR | Status: DC | PRN
  Administered 2014-12-09: .15 mg via INTRATHECAL

## 2014-12-09 MED ORDER — ONDANSETRON HCL 4 MG/2ML IJ SOLN
INTRAMUSCULAR | Status: AC
Start: 2014-12-09 — End: 2014-12-09
  Filled 2014-12-09: qty 2

## 2014-12-09 MED ORDER — ONDANSETRON HCL 4 MG/2ML IJ SOLN: 4.0000 mg | Freq: Three times a day (TID) | INTRAMUSCULAR | Status: DC | PRN

## 2014-12-09 MED ORDER — KETOROLAC TROMETHAMINE 30 MG/ML IJ SOLN: 30.0000 mg | Freq: Four times a day (QID) | INTRAMUSCULAR | Status: AC | PRN

## 2014-12-09 MED ORDER — FENTANYL CITRATE (PF) 100 MCG/2ML IJ SOLN
INTRAMUSCULAR | Status: AC
  Filled 2014-12-09: qty 2

## 2014-12-09 MED ORDER — NALBUPHINE HCL 10 MG/ML IJ SOLN: 5.0000 mg | Freq: Once | INTRAMUSCULAR | Status: AC | PRN

## 2014-12-09 MED ORDER — OXYCODONE-ACETAMINOPHEN 5-325 MG PO TABS
1.0000 | ORAL_TABLET | ORAL | Status: DC | PRN
  Administered 2014-12-10 – 2014-12-11 (×2): 1 via ORAL
  Filled 2014-12-09 (×2): qty 1

## 2014-12-09 MED ORDER — SENNOSIDES-DOCUSATE SODIUM 8.6-50 MG PO TABS
2.0000 | ORAL_TABLET | ORAL | Status: DC
  Administered 2014-12-10 (×2): 2 via ORAL
  Filled 2014-12-09 (×2): qty 2

## 2014-12-09 MED ORDER — TETANUS-DIPHTH-ACELL PERTUSSIS 5-2.5-18.5 LF-MCG/0.5 IM SUSP: 0.5000 mL | Freq: Once | INTRAMUSCULAR | Status: DC

## 2014-12-09 MED ORDER — SCOPOLAMINE 1 MG/3DAYS TD PT72
1.0000 | MEDICATED_PATCH | Freq: Once | TRANSDERMAL | Status: DC
  Administered 2014-12-09: 1.5 mg via TRANSDERMAL

## 2014-12-09 MED ORDER — NALOXONE HCL 0.4 MG/ML IJ SOLN: 0.4000 mg | INTRAMUSCULAR | Status: DC | PRN

## 2014-12-09 MED ORDER — IBUPROFEN 600 MG PO TABS
600.0000 mg | ORAL_TABLET | Freq: Four times a day (QID) | ORAL | Status: DC | PRN
  Administered 2014-12-10: 600 mg via ORAL

## 2014-12-09 MED ORDER — CEFAZOLIN SODIUM-DEXTROSE 2-3 GM-% IV SOLR
2.0000 g | INTRAVENOUS | Status: AC
  Administered 2014-12-09: 2 g via INTRAVENOUS

## 2014-12-09 MED ORDER — OXYTOCIN 10 UNIT/ML IJ SOLN
INTRAMUSCULAR | Status: AC
  Filled 2014-12-09: qty 4

## 2014-12-09 MED ORDER — LANOLIN HYDROUS EX OINT: 1.0000 "application " | TOPICAL_OINTMENT | CUTANEOUS | Status: DC | PRN

## 2014-12-09 MED ORDER — ONDANSETRON HCL 4 MG/2ML IJ SOLN
INTRAMUSCULAR | Status: DC | PRN
  Administered 2014-12-09: 4 mg via INTRAVENOUS

## 2014-12-09 MED ORDER — BUPIVACAINE LIPOSOME 1.3 % IJ SUSP
20.0000 mL | Freq: Once | INTRAMUSCULAR | Status: AC
  Administered 2014-12-09: 20 mL
  Filled 2014-12-09: qty 20

## 2014-12-09 MED ORDER — DIPHENHYDRAMINE HCL 25 MG PO CAPS
25.0000 mg | ORAL_CAPSULE | ORAL | Status: DC | PRN
Start: 2014-12-09 — End: 2014-12-11

## 2014-12-09 MED ORDER — LACTATED RINGERS IV SOLN
INTRAVENOUS | Status: DC | PRN
  Administered 2014-12-09: 08:00:00 via INTRAVENOUS

## 2014-12-09 MED ORDER — BUPIVACAINE HCL (PF) 0.25 % IJ SOLN
INTRAMUSCULAR | Status: AC
  Filled 2014-12-09: qty 20

## 2014-12-09 MED ORDER — LACTATED RINGERS IV SOLN
INTRAVENOUS | Status: DC
  Administered 2014-12-09: 08:00:00 via INTRAVENOUS
  Administered 2014-12-09: 125 mL/h via INTRAVENOUS
  Administered 2014-12-09: 07:00:00 via INTRAVENOUS

## 2014-12-09 MED ORDER — FENTANYL CITRATE (PF) 100 MCG/2ML IJ SOLN: 25.0000 ug | INTRAMUSCULAR | Status: DC | PRN

## 2014-12-09 MED ORDER — SCOPOLAMINE 1 MG/3DAYS TD PT72
1.0000 | MEDICATED_PATCH | Freq: Once | TRANSDERMAL | Status: DC
Start: 2014-12-09 — End: 2014-12-11
  Filled 2014-12-09: qty 1

## 2014-12-09 MED ORDER — 0.9 % SODIUM CHLORIDE (POUR BTL) OPTIME
TOPICAL | Status: DC | PRN
  Administered 2014-12-09: 1000 mL

## 2014-12-09 MED ORDER — FENTANYL CITRATE (PF) 100 MCG/2ML IJ SOLN
INTRAMUSCULAR | Status: DC | PRN
  Administered 2014-12-09: 25 ug via INTRATHECAL

## 2014-12-09 MED ORDER — NALOXONE HCL 1 MG/ML IJ SOLN: 1.0000 ug/kg/h | INTRAVENOUS | Status: DC | PRN

## 2014-12-09 MED ORDER — SCOPOLAMINE 1 MG/3DAYS TD PT72
MEDICATED_PATCH | TRANSDERMAL | Status: AC
  Filled 2014-12-09: qty 1

## 2014-12-09 MED ORDER — OXYTOCIN 40 UNITS IN LACTATED RINGERS INFUSION - SIMPLE MED
INTRAVENOUS | Status: DC | PRN
  Administered 2014-12-09: 40 mL via INTRAVENOUS

## 2014-12-09 MED ORDER — DIPHENHYDRAMINE HCL 25 MG PO CAPS: 25.0000 mg | ORAL_CAPSULE | Freq: Four times a day (QID) | ORAL | Status: DC | PRN

## 2014-12-09 MED ORDER — NALBUPHINE HCL 10 MG/ML IJ SOLN: 5.0000 mg | INTRAMUSCULAR | Status: DC | PRN

## 2014-12-09 MED ORDER — DIPHENHYDRAMINE HCL 50 MG/ML IJ SOLN
12.5000 mg | INTRAMUSCULAR | Status: DC | PRN
  Administered 2014-12-09: 12.5 mg via INTRAVENOUS
  Filled 2014-12-09: qty 1

## 2014-12-09 MED ORDER — WITCH HAZEL-GLYCERIN EX PADS: 1.0000 "application " | MEDICATED_PAD | CUTANEOUS | Status: DC | PRN

## 2014-12-09 MED ORDER — BUPIVACAINE IN DEXTROSE 0.75-8.25 % IT SOLN
INTRATHECAL | Status: DC | PRN
  Administered 2014-12-09: 1.4 mL via INTRATHECAL

## 2014-12-09 MED ORDER — SODIUM CHLORIDE 0.9 % IJ SOLN: 3.0000 mL | INTRAMUSCULAR | Status: DC | PRN

## 2014-12-09 MED ORDER — LACTATED RINGERS IV SOLN
INTRAVENOUS | Status: DC
  Administered 2014-12-09: 18:00:00 via INTRAVENOUS

## 2014-12-09 MED ORDER — PRENATAL MULTIVITAMIN CH
1.0000 | ORAL_TABLET | Freq: Every day | ORAL | Status: DC
  Administered 2014-12-10 – 2014-12-11 (×2): 1 via ORAL
  Filled 2014-12-09 (×2): qty 1

## 2014-12-09 MED ORDER — CEFAZOLIN SODIUM-DEXTROSE 2-3 GM-% IV SOLR
INTRAVENOUS | Status: AC
  Filled 2014-12-09: qty 50

## 2014-12-09 MED ORDER — NALBUPHINE HCL 10 MG/ML IJ SOLN
5.0000 mg | INTRAMUSCULAR | Status: DC | PRN
  Administered 2014-12-09: 5 mg via INTRAVENOUS
  Filled 2014-12-09 (×2): qty 1

## 2014-12-09 MED ORDER — METHYLERGONOVINE MALEATE 0.2 MG PO TABS: 0.2000 mg | ORAL_TABLET | ORAL | Status: DC | PRN

## 2014-12-09 MED ORDER — SIMETHICONE 80 MG PO CHEW: 80.0000 mg | CHEWABLE_TABLET | ORAL | Status: DC | PRN

## 2014-12-09 MED ORDER — ZOLPIDEM TARTRATE 5 MG PO TABS: 5.0000 mg | ORAL_TABLET | Freq: Every evening | ORAL | Status: DC | PRN

## 2014-12-09 MED ORDER — IBUPROFEN 600 MG PO TABS
600.0000 mg | ORAL_TABLET | Freq: Four times a day (QID) | ORAL | Status: DC
  Administered 2014-12-09 – 2014-12-11 (×8): 600 mg via ORAL
  Filled 2014-12-09 (×8): qty 1

## 2014-12-09 SURGICAL SUPPLY — 34 items
BENZOIN TINCTURE PRP APPL 2/3 (GAUZE/BANDAGES/DRESSINGS) ×3 IMPLANT
CLAMP CORD UMBIL (MISCELLANEOUS) ×3 IMPLANT
CLOSURE WOUND 1/2 X4 (GAUZE/BANDAGES/DRESSINGS) ×1
CLOTH BEACON ORANGE TIMEOUT ST (SAFETY) ×3 IMPLANT
CONTAINER PREFILL 10% NBF 15ML (MISCELLANEOUS) ×3 IMPLANT
DRAPE SHEET LG 3/4 BI-LAMINATE (DRAPES) ×3 IMPLANT
DRSG OPSITE POSTOP 4X10 (GAUZE/BANDAGES/DRESSINGS) ×3 IMPLANT
DURAPREP 26ML APPLICATOR (WOUND CARE) ×3 IMPLANT
ELECT REM PT RETURN 9FT ADLT (ELECTROSURGICAL) ×3
ELECTRODE REM PT RTRN 9FT ADLT (ELECTROSURGICAL) ×1 IMPLANT
EXTRACTOR VACUUM M CUP 4 TUBE (SUCTIONS) ×2 IMPLANT
EXTRACTOR VACUUM M CUP 4' TUBE (SUCTIONS) ×1
GLOVE BIO SURGEON STRL SZ7.5 (GLOVE) ×3 IMPLANT
GOWN STRL REUS W/TWL LRG LVL3 (GOWN DISPOSABLE) ×6 IMPLANT
NEEDLE HYPO 22GX1.5 SAFETY (NEEDLE) ×3 IMPLANT
NEEDLE HYPO 25X5/8 SAFETYGLIDE (NEEDLE) IMPLANT
NS IRRIG 1000ML POUR BTL (IV SOLUTION) ×3 IMPLANT
PACK C SECTION WH (CUSTOM PROCEDURE TRAY) ×3 IMPLANT
PAD ABD 7.5X8 STRL (GAUZE/BANDAGES/DRESSINGS) ×3 IMPLANT
PAD OB MATERNITY 4.3X12.25 (PERSONAL CARE ITEMS) ×3 IMPLANT
SPONGE GAUZE 4X4 12PLY STER LF (GAUZE/BANDAGES/DRESSINGS) ×6 IMPLANT
STRIP CLOSURE SKIN 1/2X4 (GAUZE/BANDAGES/DRESSINGS) ×2 IMPLANT
SUT MNCRL 0 VIOLET CTX 36 (SUTURE) ×2 IMPLANT
SUT MNCRL AB 3-0 PS2 27 (SUTURE) ×3 IMPLANT
SUT MON AB 2-0 CT1 27 (SUTURE) ×6 IMPLANT
SUT MON AB-0 CT1 36 (SUTURE) ×6 IMPLANT
SUT MONOCRYL 0 CTX 36 (SUTURE) ×4
SUT PLAIN 0 NONE (SUTURE) ×3 IMPLANT
SUT PLAIN 2 0 (SUTURE) ×2
SUT PLAIN ABS 2-0 CT1 27XMFL (SUTURE) ×1 IMPLANT
SYR CONTROL 10ML LL (SYRINGE) ×3 IMPLANT
TAPE CLOTH SURG 4X10 WHT LF (GAUZE/BANDAGES/DRESSINGS) ×3 IMPLANT
TOWEL OR 17X24 6PK STRL BLUE (TOWEL DISPOSABLE) ×3 IMPLANT
TRAY FOLEY CATH SILVER 14FR (SET/KITS/TRAYS/PACK) ×3 IMPLANT

## 2014-12-09 NOTE — Progress Notes (Signed)
Patient ID: Pamela Mcintosh, female   DOB: [birth year], 24 y.o.   MRN: [id number] Patient seen and examined. Consent witnessed and signed. No changes noted. Update completed.

## 2014-12-09 NOTE — Progress Notes (Deleted)
Baby placed skin to skin in OR.  Skin color pale but buccal membranes pink.   02 sat = 94%  Baby noted to be extremely jittery.  Spot CBG =17  Baby remained skin to skin .  Lab called for serum CBG and baby fed with 10 ml of formula

## 2014-12-09 NOTE — Transfer of Care (Signed)
Immediate Anesthesia Transfer of Care Note  Patient: Pamela Mcintosh  Procedure(s) Performed: Procedure(s) with comments: CESAREAN SECTION REPEAT  WITH BILATERAL TUBAL LIGATION (Bilateral) - EDD: 12/07/14  Patient Location: PACU  Anesthesia Type:Spinal  Level of Consciousness: awake, alert , oriented and patient cooperative  Airway & Oxygen Therapy: Patient Spontanous Breathing  Post-op Assessment: Report given to RN and Post -op Vital signs reviewed and stable  Post vital signs: Reviewed and stable  Last Vitals:  Filed Vitals:   12/09/14 0604  BP: 139/84  Pulse: 80  Temp: 36.6 C  Resp: 16    Complications: No apparent anesthesia complications

## 2014-12-09 NOTE — Anesthesia Procedure Notes (Signed)
Spinal Patient location during procedure: OR Start time: 12/09/2014 7:30 AM Staffing Anesthesiologist: Mal Amabile Performed by: anesthesiologist  Preanesthetic Checklist Completed: patient identified, site marked, surgical consent, pre-op evaluation, timeout performed, IV checked, risks and benefits discussed and monitors and equipment checked Spinal Block Patient position: sitting Prep: site prepped and draped and DuraPrep Patient monitoring: heart rate, cardiac monitor, continuous pulse ox and blood pressure Approach: midline Location: L4-5 Injection technique: single-shot Needle Needle type: Sprotte  Needle gauge: 24 G Needle length: 9 cm Needle insertion depth: 5 cm Assessment Sensory level: T4 Additional Notes Patient tolerated procedure well. Adequate sensory level.

## 2014-12-09 NOTE — Op Note (Signed)
Cesarean Section Procedure Note  Indications: oligohydramnios and previous uterine incision kerr x2  Pre-operative Diagnosis: 40 week 1 day pregnancy.  Post-operative Diagnosis: same  Surgeon: Lenoard Aden   Assistants: Denyse Amass, CNM  Anesthesia: Local anesthesia 0.25.% bupivacaine and Spinal anesthesia  ASA Class: 2  Procedure Details  The patient was seen in the Holding Room. The risks, benefits, complications, treatment options, and expected outcomes were discussed with the patient.  The patient concurred with the proposed plan, giving informed consent. The risks of anesthesia, infection, bleeding and possible injury to other organs discussed. Injury to bowel, bladder, or ureter with possible need for repair discussed. Possible need for transfusion with secondary risks of hepatitis or HIV acquisition discussed. Post operative complications to include but not limited to DVT, PE and Pneumonia noted. The site of surgery properly noted/marked. The patient was taken to Operating Room # 9, identified as Pamela Mcintosh and the procedure verified as C-Section Delivery. A Time Out was held and the above information confirmed.  After induction of anesthesia, the patient was draped and prepped in the usual sterile manner. A Pfannenstiel incision was made and carried down through the subcutaneous tissue to the fascia. Fascial incision was made and extended transversely using Mayo scissors. The fascia was separated from the underlying rectus tissue superiorly and inferiorly. The peritoneum was identified and entered. Peritoneal incision was extended longitudinally. The utero-vesical peritoneal reflection was incised transversely and the bladder flap was sharply freed from the lower uterine segment. A low transverse uterine incision(Kerr hysterotomy) was made.  A LUS window was noted. Delivered from OT presentation was a  Female with one pull vacuum assistance with Apgar scores of 8 at one minute and 9 at  five minutes. Bulb suctioning gently performed. Neonatal team in attendance.After the umbilical cord was clamped and cut cord blood was obtained for evaluation. The placenta was removed intact and appeared normal. The uterus was curetted with a dry lap pack. Good hemostasis was noted.The uterine outline, tubes and ovaries appeared normal. The uterine incision was closed with running locked sutures of 0 Monocryl x 1 layers. Hemostasis was observed. Modified Pomeroy TL performed and Tubal segments excised. The parietal peritoneum was closed with a running 2-0 Monocryl suture. The fascia was then reapproximated with running sutures of 0 Monocryl. The skin was reapproximated with 3-0 monocryl after [place] closure with 2-0 plain.  Instrument, sponge, and needle counts were correct prior the abdominal closure and at the conclusion of the case.   Findings: FTLM, OT, fundal placenta  Estimated Blood Loss:  400         Drains: foley                 Specimens: placenta and TL segments                 Complications:  None; patient tolerated the procedure well.         Disposition: PACU - hemodynamically stable.         Condition: stable  Attending Attestation: I performed the procedure.

## 2014-12-09 NOTE — Anesthesia Postprocedure Evaluation (Signed)
  Anesthesia Post-op Note  Patient: Pamela Mcintosh  Procedure(s) Performed: Procedure(s) with comments: CESAREAN SECTION REPEAT  WITH BILATERAL TUBAL LIGATION (Bilateral) - EDD: 12/07/14  Patient Location: PACU  Anesthesia Type:Spinal  Level of Consciousness: awake, alert  and oriented  Airway and Oxygen Therapy: Patient Spontanous Breathing  Post-op Pain: none  Post-op Assessment: Post-op Vital signs reviewed, Patient's Cardiovascular Status Stable, Respiratory Function Stable, Patent Airway, No signs of Nausea or vomiting, Pain level controlled, No headache, No backache and Spinal receding LLE Motor Response: Purposeful movement LLE Sensation: Tingling RLE Motor Response: Purposeful movement RLE Sensation: Tingling      Post-op Vital Signs: Reviewed and stable  Last Vitals:  Filed Vitals:   12/09/14 0945  BP: 125/73  Pulse: 70  Temp: 36.6 C  Resp: 22    Complications: No apparent anesthesia complications

## 2014-12-10 ENCOUNTER — Encounter (HOSPITAL_COMMUNITY): Payer: Self-pay | Admitting: *Deleted

## 2014-12-10 LAB — TYPE AND SCREEN
ABO/RH(D): A NEG
Antibody Screen: POSITIVE
DAT, IGG: NEGATIVE
Unit division: 0
Unit division: 0

## 2014-12-10 LAB — CBC
HCT: 31.3 % — ABNORMAL LOW (ref 36.0–46.0)
Hemoglobin: 10.1 g/dL — ABNORMAL LOW (ref 12.0–15.0)
MCH: 28.3 pg (ref 26.0–34.0)
MCHC: 32.3 g/dL (ref 30.0–36.0)
MCV: 87.7 fL (ref 78.0–100.0)
Platelets: 178 10*3/uL (ref 150–400)
RBC: 3.57 MIL/uL — AB (ref 3.87–5.11)
RDW: 15.5 % (ref 11.5–15.5)
WBC: 8.9 10*3/uL (ref 4.0–10.5)

## 2014-12-10 MED ORDER — HYDROCORTISONE 1 % EX CREA
TOPICAL_CREAM | Freq: Three times a day (TID) | CUTANEOUS | Status: DC
  Administered 2014-12-10: 22:00:00 via TOPICAL
  Filled 2014-12-10: qty 28

## 2014-12-10 NOTE — Progress Notes (Signed)
Patient ID: Pamela Mcintosh, female   DOB: 1990-10-24, 24 y.o.   MRN: [id number] Subjective: S/P Repeat Cesarean Delivery with BTL POD# 1 Information for the patient's newborn:  Jonelle, Bann [[id number]]  female  / circ planning outpatient - d/t misaligned urethra  Reports feeling well Feeding: bottle Patient reports tolerating PO.  Breast symptoms: none Pain controlled with ibuprofen (OTC) Denies HA/SOB/C/P/N/V/dizziness. Flatus present. No BM. She reports vaginal bleeding as normal, without clots.  She is ambulating, urinating without difficult.     Objective:   VS:  Filed Vitals:   12/09/14 1711 12/09/14 2115 12/10/14 0115 12/10/14 0516  BP: 112/62 100/54 115/54 104/58  Pulse: 65 62 72 65  Temp: 97.8 F (36.6 C) 98.2 F (36.8 C) 98.2 F (36.8 C) 98 F (36.7 C)  TempSrc: Oral Oral Oral   Resp: Height:      Weight:      SpO2: 98% 97% 97%      Intake/Output Summary (Last 24 hours) at 12/10/14 7829 Last data filed at 12/10/14 0220  Gross per 24 hour  Intake   1510 ml  Output   2175 ml  Net   -665 ml        Recent Labs  12/10/14 0603  WBC 8.9  HGB 10.1*  HCT 31.3*  PLT 178     Blood type: A NEG (07/29 1005) / Rhophylac NOT indicated - infant Rh NEG  Rubella: Immune (12/31 0000)     Physical Exam:  General: alert, cooperative and no distress CV: Regular rate and rhythm, S1S2 present or without murmur or extra heart sounds Resp: clear Abdomen: soft, nontender, normal bowel sounds Incision: serous drainage present and Tegaderm and Honeycomb C/D/I - skin well-approximated with sutures Uterine Fundus: firm, 1 FB below umbilicus, nontender Lochia: minimal Ext: extremities normal, atraumatic, no cyanosis or edema, Homans sign is negative, no sign of DVT and no edema, redness or tenderness in the calves or thighs   Assessment/Plan: 24 y.o.   POD# 1.  s/p Cesarean Delivery.  Indications: Repeat with BTL                Principal Problem:  Postpartum care following cesarean delivery & BTL (8/1) Active Problems:   Cesarean delivery delivered Contact Dermatitis   Doing well, stable.               Regular diet as tolerated Ambulate Routine post-op care 1% Hydrocortisone Cream applied to affected area on abdomen 2-3x/day Desires early discharge home tomorrow AM  Raelyn Mora, M, MSN, CNM 12/10/2014, 8:39 AM

## 2014-12-11 MED ORDER — IBUPROFEN 600 MG PO TABS: 600.0000 mg | ORAL_TABLET | Freq: Four times a day (QID) | ORAL | Status: DC | PRN

## 2014-12-11 MED ORDER — OXYCODONE-ACETAMINOPHEN 5-325 MG PO TABS: 1.0000 | ORAL_TABLET | ORAL | Status: DC | PRN

## 2014-12-11 NOTE — Discharge Summary (Signed)
DISCHARGE SUMMARY:  Patient ID: Pamela Mcintosh MRN: [id number] DOB/AGE: 1990/09/26 24 y.o.  Admit date: 12/09/2014 Admission Diagnoses: 40.[redacted] weeks gestation, previous Cesarean Section x2   Discharge date: 12/11/2014 Discharge Diagnoses: S/P C/S  & BTL on 12/09/2014, Contact dermatitis        Prenatal history: [id number]   EDC: 12/07/2014, by Last Menstrual Period  Prenatal care at [facility] since [redacted] wks gestation. Primary provider: Dr. Billy Coast Prenatal course complicated by oligohydramnios, previous CS x2, Rh negative  Prenatal labs: ABO, Rh: --/--/A NEG (07/29 1005) / Rhophylac 09/26/2014 Antibody: POS (07/29 1005) Rubella:   Immune RPR: Non Reactive (07/29 1005)  HBsAg: Negative (12/31 0000)  HIV: Non-reactive (12/31 0000)  GBS:   neg GTT: 156, passed 3hr  Medical / Surgical History :  Past medical history:  Past Medical History  Diagnosis Date  . H/O cesarean section 2011  . Postpartum care following cesarean delivery (5/29) 10/06/2013  . Medical history non-contributory   . Headache     rarely    Past surgical history:  Past Surgical History  Procedure Laterality Date  . Cesarean section      2011  . Cesarean section N/A 10/05/2013    Procedure: Repeat CESAREAN SECTION;  Surgeon: Lenoard Aden, MD;  Location: [facility];  Service: Obstetrics;  Laterality: N/A;  EDD: 10/11/13  . Cesarean section with bilateral tubal ligation Bilateral 12/09/2014    Procedure: CESAREAN SECTION REPEAT  WITH BILATERAL TUBAL LIGATION;  Surgeon: Olivia Mackie, MD;  Location: [facility];  Service: Obstetrics;  Laterality: Bilateral;  EDD: 12/07/14     Medications on Admission: No prescriptions prior to admission    Allergies: Morphine and related   Intrapartum Course:  Admitted for schedule RCS under regional anesthesia   Postpartum Course: Complicated by contact dermatitis-pressure dsg suspected etiology  Physical Exam:   VSS: Last menstrual period 03/02/2014, unknown if  currently breastfeeding.  LABS:  Recent Labs  12/10/14 0603  WBC 8.9  HGB 10.1*  PLT 178    General: alert and oriented x3 Heart: RRR Lungs: CTA bilaterally GI: soft, non-tender, non-distended, BS x4 Lochia: small Uterus: firm below umbilicus Incision: well approximated with suture and steri strips; honeycomb dressing-no significant erythema, drainage, or edema Extremities: No edema, Homans neg   Newborn Data Live born female  Birth Weight: 7 lb 9.7 oz (3450 g) APGAR: 8, 9  See operative report for further details  Home with mother.  Discharge Instructions:  Wound Care: keep clean and dry / remove honeycomb POD 5-6 Postpartum Instructions: [facility] discharge booklet - instructions reviewed  Medication List    STOP taking these medications       acetaminophen 500 MG tablet  Commonly known as: TYLENOL     ranitidine 150 MG tablet  Commonly known as: ZANTAC      TAKE these medications       ibuprofen 600 MG tablet  Commonly known as: ADVIL,MOTRIN  Take 1 tablet (600 mg total) by mouth every 6 (six) hours as needed for mild pain.     multivitamin-prenatal 27-0.8 MG Tabs tablet  Take 1 tablet by mouth daily at 12 noon.     oxyCODONE-acetaminophen 5-325 MG per tablet  Commonly known as: PERCOCET/ROXICET  Take 1-2 tablets by mouth every 4 (four) hours as needed (for pain scale greater than 7).            Follow-up Information    Follow up with Lenoard Aden, MD. Schedule an appointment  as soon as possible for a visit in 6 weeks.   Specialty: Obstetrics and Gynecology   Contact information:   [address] [place] [address] [phone]             Signed: Donette Larry, Dorris Carnes MSN, CNM 12/11/2014, 1:14 PM

## 2014-12-11 NOTE — Progress Notes (Signed)
POD # 2  Subjective: Pt reports feeling well, desires early discharge/ Pain controlled with Motrin and Percocet Tolerating po/Voiding without problems/ No n/v/ Flatus present, +BM Activity: ad lib Bleeding is light Newborn info:  Information for the patient's newborn:  Eliane, [place] [[id number]]  female  / Circumcision: planning outpt/ Feeding: breast   Objective: VS: VS:  Filed Vitals:   12/10/14 0115 12/10/14 0516 12/10/14 1720 12/11/14 0635  BP: 115/54 104/58 111/68 114/60  Pulse: 72 65 75 68  Temp: 98.2 F (36.8 C) 98 F (36.7 C) 98.4 F (36.9 C) 98 F (36.7 C)  TempSrc: Oral  Oral   Resp: Height:      Weight:      SpO2: 97%       I&O: Intake/Output      08/02 0701 - 08/03 0700 08/03 0701 - 08/04 0700   P.O.     I.V. (mL/kg)     Total Intake(mL/kg)     Urine (mL/kg/hr)     Blood     Total Output       Net              LABS:  Recent Labs  12/10/14 0603  WBC 8.9  HGB 10.1*  PLT 178   Blood type: --/--/A NEG (07/29 1005) Rubella: Immune (12/31 0000)           Physical Exam:  General: alert, cooperative and no distress CV: Regular rate and rhythm Resp: CTA bilaterally Abdomen: soft, nontender, normal bowel sounds Incision: Covered with Tegaderm and honeycomb dressing; no significant drainage, edema, bruising, or erythema; well approximated with suture and steri strips. Mild erythema superior and right lateral to tegaderm. Uterine Fundus: firm, below umbilicus, nontender Lochia: minimal Ext: extremities normal, atraumatic, no cyanosis or edema and Homans sign is negative, no sign of DVT    Assessment: POD # 2/ [id number]/ S/P C/Section & BTL d/t repeat  Contact dermatitis Rh negative-baby Rh neg Doing well and stable for discharge home  Plan: Discharge home RX's: Ibuprofen  po Q 6 hrs prn pain #30 Refill x 1 Percocet 5/325 1 - 2 tabs po every 4 hrs prn pain #30 Refill x 0 Hydrocortisone cream 1% apply to affected area TID  prn-has med Follow up in 6 wks for postpartum check at [facility] booklet given    Signed: Lawernce Pitts, MSN, CNM 12/11/2014, 10:36 AM

## 2014-12-25 NOTE — Discharge Summary (Signed)
DISCHARGE SUMMARY:  Patient ID: Pamela Mcintosh MRN: [id number] DOB/AGE: 1990-07-11 24 y.o.  Admit date: 12/09/2014 Admission Diagnoses: 40.[redacted] weeks gestation, previous Cesarean Section x2   Discharge date: 12/25/2014 Discharge Diagnoses: S/P C/S  & BTL on 12/09/2014, Contact dermatitis        Prenatal history: [id number]   EDC: 12/07/2014, by Last Menstrual Period  Prenatal care at [facility] since [redacted] wks gestation. Primary provider: Dr. Billy Coast Prenatal course complicated by oligohydramnios, previous CS x2, Rh negative  Prenatal labs: ABO, Rh: --/--/A NEG (07/29 1005) / Rhophylac 09/26/2014 Antibody: POS (07/29 1005) Rubella:   Immune RPR: Non Reactive (07/29 1005)  HBsAg: Negative (12/31 0000)  HIV: Non-reactive (12/31 0000)  GBS:   neg GTT: 156, passed 3hr  Medical / Surgical History :  Past medical history:  Past Medical History  Diagnosis Date  . H/O cesarean section 2011  . Postpartum care following cesarean delivery (5/29) 10/06/2013  . Medical history non-contributory   . Headache     rarely    Past surgical history:  Past Surgical History  Procedure Laterality Date  . Cesarean section      2011  . Cesarean section N/A 10/05/2013    Procedure: Repeat CESAREAN SECTION;  Surgeon: Lenoard Aden, MD;  Location: [facility];  Service: Obstetrics;  Laterality: N/A;  EDD: 10/11/13  . Cesarean section with bilateral tubal ligation Bilateral 12/09/2014    Procedure: CESAREAN SECTION REPEAT  WITH BILATERAL TUBAL LIGATION;  Surgeon: Olivia Mackie, MD;  Location: [facility];  Service: Obstetrics;  Laterality: Bilateral;  EDD: 12/07/14     Medications on Admission: No prescriptions prior to admission    Allergies: Morphine and related   Intrapartum Course:  Admitted for schedule RCS under regional anesthesia   Postpartum Course: Complicated by contact dermatitis-pressure dsg suspected etiology  Physical Exam:   VSS: Blood pressure 114/60, pulse 68,  temperature 98 F (36.7 C), temperature source Oral, resp. rate 18, height  (1.6 m), weight 80.287 kg (177 lb), last menstrual period 03/02/2014, SpO2 97 %, unknown if currently breastfeeding.  LABS: No results for input(s): WBC, HGB, PLT in the last 72 hours.  General: alert and oriented x3 Heart: RRR Lungs: CTA bilaterally GI: soft, non-tender, non-distended, BS x4 Lochia: small Uterus: firm below umbilicus Incision: well approximated with suture and steri strips; honeycomb dressing-no significant erythema, drainage, or edema Extremities: No edema, Homans neg   Newborn Data Live born female  Birth Weight: 7 lb 9.7 oz (3450 g) APGAR: 8, 9  See operative report for further details  Home with mother.  Discharge Instructions:  Wound Care: keep clean and dry / remove honeycomb POD 5-6 Postpartum Instructions: [facility] discharge booklet - instructions reviewed  Medication List    STOP taking these medications       acetaminophen 500 MG tablet  Commonly known as: TYLENOL     ranitidine 150 MG tablet  Commonly known as: ZANTAC      TAKE these medications       ibuprofen 600 MG tablet  Commonly known as: ADVIL,MOTRIN  Take 1 tablet (600 mg total) by mouth every 6 (six) hours as needed for mild pain.     multivitamin-prenatal 27-0.8 MG Tabs tablet  Take 1 tablet by mouth daily at 12 noon.     oxyCODONE-acetaminophen 5-325 MG per tablet  Commonly known as: PERCOCET/ROXICET  Take 1-2 tablets by mouth every 4 (four) hours as needed (for pain scale greater than 7).  Follow-up Information    Follow up with Lenoard Aden, MD. Schedule an appointment as soon as possible for a visit in 6 weeks.   Specialty: Obstetrics and Gynecology   Contact information:   [address] [place] [address] [phone]         Follow-up Information    Follow up with Lenoard Aden, MD. Schedule an  appointment as soon as possible for a visit in 6 weeks.   Specialty:  Obstetrics and Gynecology   Contact information:   [address] [place] [address] [phone]         Signed: Donette Larry, Dorris Carnes MSN, CNM 12/25/2014, 4:42 PM

## 2014-12-25 NOTE — Discharge Summary (Signed)
DISCHARGE SUMMARY:  Patient ID: Pamela Mcintosh MRN: [id number] DOB/AGE: [birth year] 24 y.o.  Admit date: 12/09/2014 Admission Diagnoses: 40.[redacted] weeks gestation, previous Cesarean Section x2   Discharge date: 12/11/2014 Discharge Diagnoses: S/P C/S & BTL on 12/09/2014, Contact dermatitis    Prenatal history: [id number]  EDC: 12/07/2014, by Last Menstrual Period  Prenatal care at [facility] since [redacted] wks gestation. Primary provider: Dr. Billy Coast Prenatal course complicated by oligohydramnios, previous CS x2, Rh negative  Prenatal labs: ABO, Rh: --/--/A NEG (07/29 1005) / Rhophylac 09/26/2014 Antibody: POS (07/29 1005) Rubella:   Immune RPR: Non Reactive (07/29 1005)  HBsAg: Negative (12/31 0000)  HIV: Non-reactive (12/31 0000)  GBS:   neg GTT: 156, passed 3hr  Medical / Surgical History :  Past medical history:  Past Medical History  Diagnosis Date  . H/O cesarean section 2011  . Postpartum care following cesarean delivery (5/29) 10/06/2013  . Medical history non-contributory   . Headache     rarely    Past surgical history:  Past Surgical History  Procedure Laterality Date  . Cesarean section      2011  . Cesarean section N/A 10/05/2013    Procedure: Repeat CESAREAN SECTION; Surgeon: Lenoard Aden, MD; Location: [facility]; Service: Obstetrics; Laterality: N/A; EDD: 10/11/13  . Cesarean section with bilateral tubal ligation Bilateral 12/09/2014    Procedure: CESAREAN SECTION REPEAT WITH BILATERAL TUBAL LIGATION; Surgeon: Olivia Mackie, MD; Location: [facility]; Service: Obstetrics; Laterality: Bilateral; EDD: 12/07/14     Medications on Admission: No prescriptions prior to admission    Allergies: Morphine and related   Intrapartum Course:  Admitted for schedule RCS under regional anesthesia   Postpartum Course: Complicated by contact dermatitis-pressure  dsg suspected etiology  Physical Exam:   VSS: Last menstrual period 03/02/2014, unknown if currently breastfeeding.   Recent Labs (last 2 labs)     LABS:  Recent Labs  12/10/14 0603  WBC 8.9  HGB 10.1*  PLT 178      General: alert and oriented x3 Heart: RRR Lungs: CTA bilaterally GI: soft, non-tender, non-distended, BS x4 Lochia: small Uterus: firm below umbilicus Incision: well approximated with suture and steri strips; honeycomb dressing-no significant erythema, drainage, or edema Extremities: No edema, Homans neg   Newborn Data Live born female  Birth Weight: 7 lb 9.7 oz (3450 g) APGAR: 8, 9  See operative report for further details  Home with mother.  Discharge Instructions:  Wound Care: keep clean and dry / remove honeycomb POD 5-6 Postpartum Instructions: [facility] discharge booklet - instructions reviewed  Medication List    STOP taking these medications       acetaminophen 500 MG tablet  Commonly known as: TYLENOL     ranitidine 150 MG tablet  Commonly known as: ZANTAC      TAKE these medications       ibuprofen 600 MG tablet  Commonly known as: ADVIL,MOTRIN  Take 1 tablet (600 mg total) by mouth every 6 (six) hours as needed for mild pain.     multivitamin-prenatal 27-0.8 MG Tabs tablet  Take 1 tablet by mouth daily at 12 noon.     oxyCODONE-acetaminophen 5-325 MG per tablet  Commonly known as: PERCOCET/ROXICET  Take 1-2 tablets by mouth every 4 (four) hours as needed (for pain scale greater than 7).            Follow-up Information    Follow up with Lenoard Aden, MD. Schedule an appointment as soon as possible for  a visit in 6 weeks.   Specialty: Obstetrics and Gynecology   Contact information:   [address] [place] [address] [phone]             Signed: Donette Larry, Dorris Carnes MSN, CNM 12/11/2014, 1:14  PM

## 2015-03-18 ENCOUNTER — Encounter (HOSPITAL_COMMUNITY): Payer: Self-pay | Admitting: *Deleted

## 2015-03-18 ENCOUNTER — Emergency Department (HOSPITAL_COMMUNITY): Payer: 59

## 2015-03-18 ENCOUNTER — Emergency Department (HOSPITAL_COMMUNITY)
Admission: EM | Admit: 2015-03-18 | Discharge: 2015-03-18 | Disposition: A | Discharge status: Home / Self Care | Payer: 59 | Attending: Emergency Medicine | Admitting: Emergency Medicine

## 2015-03-18 DIAGNOSIS — R509 Fever, unspecified: Secondary | ICD-10-CM | POA: Diagnosis not present

## 2015-03-18 DIAGNOSIS — R000 Tachycardia, unspecified: Secondary | ICD-10-CM | POA: Diagnosis not present

## 2015-03-18 DIAGNOSIS — R05 Cough: Secondary | ICD-10-CM | POA: Diagnosis not present

## 2015-03-18 DIAGNOSIS — Z79899 Other long term (current) drug therapy: Secondary | ICD-10-CM | POA: Insufficient documentation

## 2015-03-18 DIAGNOSIS — R109 Unspecified abdominal pain: Secondary | ICD-10-CM | POA: Diagnosis not present

## 2015-03-18 DIAGNOSIS — R0602 Shortness of breath: Secondary | ICD-10-CM | POA: Diagnosis not present

## 2015-03-18 DIAGNOSIS — R112 Nausea with vomiting, unspecified: Secondary | ICD-10-CM | POA: Insufficient documentation

## 2015-03-18 DIAGNOSIS — M546 Pain in thoracic spine: Secondary | ICD-10-CM | POA: Insufficient documentation

## 2015-03-18 DIAGNOSIS — Z9889 Other specified postprocedural states: Secondary | ICD-10-CM | POA: Diagnosis not present

## 2015-03-18 DIAGNOSIS — R319 Hematuria, unspecified: Secondary | ICD-10-CM | POA: Diagnosis not present

## 2015-03-18 DIAGNOSIS — R091 Pleurisy: Secondary | ICD-10-CM | POA: Diagnosis not present

## 2015-03-18 LAB — URINALYSIS, ROUTINE W REFLEX MICROSCOPIC
BILIRUBIN URINE: NEGATIVE
Glucose, UA: NEGATIVE mg/dL
KETONES UR: NEGATIVE mg/dL
Leukocytes, UA: NEGATIVE
NITRITE: NEGATIVE
Protein, ur: NEGATIVE mg/dL
SPECIFIC GRAVITY, URINE: 1.01 (ref 1.005–1.030)
Urobilinogen, UA: 2 mg/dL — ABNORMAL HIGH (ref 0.0–1.0)
pH: 7 (ref 5.0–8.0)

## 2015-03-18 LAB — CBC WITH DIFFERENTIAL/PLATELET
BASOS ABS: 0 10*3/uL (ref 0.0–0.1)
BASOS PCT: 0 %
EOS PCT: 0 %
Eosinophils Absolute: 0 10*3/uL (ref 0.0–0.7)
HCT: 30.6 % — ABNORMAL LOW (ref 36.0–46.0)
Hemoglobin: 10.5 g/dL — ABNORMAL LOW (ref 12.0–15.0)
Lymphocytes Relative: 19 %
Lymphs Abs: 1.1 10*3/uL (ref 0.7–4.0)
MCH: 29.5 pg (ref 26.0–34.0)
MCHC: 34.3 g/dL (ref 30.0–36.0)
MCV: 86 fL (ref 78.0–100.0)
MONO ABS: 0.8 10*3/uL (ref 0.1–1.0)
MONOS PCT: 13 %
Neutro Abs: 3.9 10*3/uL (ref 1.7–7.7)
Neutrophils Relative %: 68 %
PLATELETS: 240 10*3/uL (ref 150–400)
RBC: 3.56 MIL/uL — ABNORMAL LOW (ref 3.87–5.11)
RDW: 14.4 % (ref 11.5–15.5)
WBC: 5.8 10*3/uL (ref 4.0–10.5)

## 2015-03-18 LAB — URINE MICROSCOPIC-ADD ON

## 2015-03-18 LAB — BASIC METABOLIC PANEL
ANION GAP: 11 (ref 5–15)
BUN: 8 mg/dL (ref 6–20)
CALCIUM: 8 mg/dL — AB (ref 8.9–10.3)
CO2: 23 mmol/L (ref 22–32)
CREATININE: 0.85 mg/dL (ref 0.44–1.00)
Chloride: 103 mmol/L (ref 101–111)
GFR calc Af Amer: >60 mL/min (ref >60)
Glucose, Bld: 109 mg/dL — ABNORMAL HIGH (ref 65–99)
Potassium: 2.9 mmol/L — ABNORMAL LOW (ref 3.5–5.1)
Sodium: 137 mmol/L (ref 135–145)

## 2015-03-18 LAB — I-STAT CG4 LACTIC ACID, ED: LACTIC ACID, VENOUS: 0.84 mmol/L (ref 0.5–2.0)

## 2015-03-18 LAB — I-STAT TROPONIN, ED: TROPONIN I, POC: 0.01 ng/mL (ref 0.00–0.08)

## 2015-03-18 LAB — MAGNESIUM: MAGNESIUM: 1.8 mg/dL (ref 1.7–2.4)

## 2015-03-18 MED ORDER — SODIUM CHLORIDE 0.9 % IV BOLUS (SEPSIS)
1000.0000 mL | Freq: Once | INTRAVENOUS | Status: AC
  Administered 2015-03-18: 1000 mL via INTRAVENOUS

## 2015-03-18 MED ORDER — POTASSIUM CHLORIDE CRYS ER 20 MEQ PO TBCR
40.0000 meq | EXTENDED_RELEASE_TABLET | Freq: Once | ORAL | Status: AC
  Administered 2015-03-18: 40 meq via ORAL
  Filled 2015-03-18: qty 2

## 2015-03-18 MED ORDER — KETOROLAC TROMETHAMINE 30 MG/ML IJ SOLN
30.0000 mg | Freq: Once | INTRAMUSCULAR | Status: AC
  Administered 2015-03-18: 30 mg via INTRAVENOUS
  Filled 2015-03-18: qty 1

## 2015-03-18 MED ORDER — POTASSIUM CHLORIDE 20 MEQ PO PACK
40.0000 meq | PACK | Freq: Once | ORAL | Status: DC
  Filled 2015-03-18: qty 2

## 2015-03-18 NOTE — ED Provider Notes (Signed)
CSN: [id number]     Arrival date & time 03/18/15  1025 History  By signing my name below, I, Freida Busman, attest that this documentation has been prepared under the direction and in the presence of Lavera Guise, MD . Electronically Signed: Freida Busman, Scribe. 03/18/2015. 7:19 AM.    Chief Complaint  Patient presents with  . Fever    The history is provided by the patient and the spouse. No language interpreter was used.     HPI Comments:  Pamela Mcintosh is a 24 y.o. female who presents to the Emergency Department complaining of moderate left upper back pain, radiating into the mid chest for ~ 1 week. She describes her pain as a dull ache. She has had associating productive cough, body aches, fever for ~ 1 week with TMAX of 104 this AM, nausea and multiple episodes of vomiting. No sore throat or congestion. Pt saw PCP a few days ago who suspected PNA and placed pt on rocephin and Zithromax; states she vomited after taking the last pill this AM. She has taken tylenol with moderate relief of fever. She denies abdominal pain, diarrhea, dysuria, and pain and swelling in her BLE. Husband notes mild SOB as well. Pt denies personal history and family history of DVT/PE or recent immobilization. She also denies sick contacts, but works as a Engineer, civil (consulting) in the hospital. She reports C-section in Aug 2016 but denies subsequent hospitalizations. No alleviating factors noted.  Currently on period  Past Medical History  Diagnosis Date  . H/O cesarean section 2011  . Postpartum care following cesarean delivery (5/29) 10/06/2013  . Medical history non-contributory   . Headache     rarely   Past Surgical History  Procedure Laterality Date  . Cesarean section      2011  . Cesarean section N/A 10/05/2013    Procedure: Repeat CESAREAN SECTION;  Surgeon: Lenoard Aden, MD;  Location: [facility];  Service: Obstetrics;  Laterality: N/A;  EDD: 10/11/13  . Cesarean section with bilateral tubal ligation Bilateral  12/09/2014    Procedure: CESAREAN SECTION REPEAT  WITH BILATERAL TUBAL LIGATION;  Surgeon: Olivia Mackie, MD;  Location: [facility];  Service: Obstetrics;  Laterality: Bilateral;  EDD: 12/07/14   History reviewed. No pertinent family history. Social History  Substance Use Topics  . Smoking status: Never Smoker   . Smokeless tobacco: Never Used  . Alcohol Use: No   OB History    Gravida Para Term Preterm AB TAB SAB Ectopic Multiple Living   0 2     Review of Systems  Constitutional: Positive for fever.  Respiratory: Positive for shortness of breath.   Cardiovascular: Negative for leg swelling.  Gastrointestinal: Positive for nausea, vomiting and abdominal pain. Negative for diarrhea.  Genitourinary: Positive for hematuria and flank pain. Negative for dysuria.  Musculoskeletal: Positive for myalgias.    Allergies  Morphine and related  Home Medications   Prior to Admission medications   Medication Sig Start Date End Date Taking? Authorizing Provider  ibuprofen (ADVIL,MOTRIN) 600 MG tablet Take 1 tablet (600 mg total) by mouth every 6 (six) hours as needed for mild pain. 12/11/14   Lawernce Pitts, CNM  oxyCODONE-acetaminophen (PERCOCET/ROXICET) 5-325 MG per tablet Take 1-2 tablets by mouth every 4 (four) hours as needed (for pain scale greater than 7). 12/11/14   Lawernce Pitts, CNM  Prenatal Vit-Fe Fumarate-FA (MULTIVITAMIN-PRENATAL) 27-0.8 MG TABS tablet Take 1 tablet by mouth  daily at 12 noon.    Historical Provider, MD   BP 133/82 mmHg  Pulse 83  Temp(Src) 99.1 F (37.3 C) (Oral)  Resp 18  Ht 5\' 2"  (1.575 m)  Wt 145 lb (65.772 kg)  BMI 26.51 kg/m2  SpO2 99%  LMP 03/15/2015  Breastfeeding? No Physical Exam   Physical Exam  Nursing note and vitals reviewed. Constitutional: Well developed, well nourished, non-toxic, and in no acute distress Head: Normocephalic and atraumatic.  Mouth/Throat: Oropharynx is clear and moist.  Neck: Normal range of motion. Neck  supple.  Cardiovascular: Tachycardic with  regular rhythm.   Pulmonary/Chest: Effort normal and breath sounds normal.  Abdominal: Soft. There is no tenderness. There is no rebound and no guarding. No CVA tenderness. Musculoskeletal: Normal range of motion.  Neurological: Alert, no facial droop, fluent speech, moves all extremities symmetrically Skin: Skin is warm and dry.  Psychiatric: Cooperative   ED Course  Procedures   DIAGNOSTIC STUDIES:  Oxygen Saturation is 97% on RA, normal by my interpretation.    COORDINATION OF CARE:  12:00 PM Will order imaging studies.  Discussed treatment plan with pt at bedside and pt agreed to plan.  Labs Review Labs Reviewed  CBC WITH DIFFERENTIAL/PLATELET - Abnormal; Notable for the following:    RBC 3.56 (*)    Hemoglobin 10.5 (*)    HCT 30.6 (*)    All other components within normal limits  BASIC METABOLIC PANEL - Abnormal; Notable for the following:    Potassium 2.9 (*)    Glucose, Bld 109 (*)    Calcium 8.0 (*)    All other components within normal limits  URINALYSIS, ROUTINE W REFLEX MICROSCOPIC (NOT [facility]) - Abnormal; Notable for the following:    Hgb urine dipstick LARGE (*)    Urobilinogen, UA 2.0 (*)    All other components within normal limits  URINE MICROSCOPIC-ADD ON - Abnormal; Notable for the following:    Squamous Epithelial / LPF FEW (*)    All other components within normal limits  MAGNESIUM  I-STAT CG4 LACTIC ACID, ED  Rosezena SensorI-STAT TROPOININ, ED    Imaging Review Dg Chest 2 View  03/18/2015  CLINICAL DATA:  24 year old female with high fevers and nonproductive cough for 1 week. Recent negative flu test. Initial encounter. EXAM: CHEST  2 VIEW COMPARISON:  None. FINDINGS: Normal lung volumes. Cardiac size at the upper limits of normal. Other mediastinal contours are within normal limits. Visualized tracheal air column is within normal limits. The lungs are clear. No pneumothorax or effusion. Negative visible bowel gas  pattern. No osseous abnormality identified. IMPRESSION: Negative, no acute cardiopulmonary abnormality. Electronically Signed   By: Odessa FlemingH  Hall M.D.   On: 03/18/2015 11:52   I have personally reviewed and evaluated these images and lab results as part of my medical decision-making.   EKG Interpretation   Date/Time:  Tuesday March 18 2015 12:41:26 EST Ventricular Rate:  79 PR Interval:  133 QRS Duration: 101 QT Interval:  359 QTC Calculation: 411 R Axis:   82 Text Interpretation:  Sinus rhythm Borderline Q waves in inferior leads No  prior EKG for comparison Confirmed by Yisrael Obryan MD, Ashvin Adelson [phone]([id number]) on 03/18/2015  1:51:27 PM      MDM   Final diagnoses:  Fever, unspecified fever cause  Pleurisy    24 year old female who presents with fever, productive cough, chest pain for almost 1 week. She has also finished a course of antibiotics during this course of illness, with no improvement  in symptoms. She is febrile here, but hemodynamically stable and not acutely ill appearing. Abdomen is benign and nontender. Cardiopulmonary exam is unremarkable. chest x-ray is performed here, showing no acute cardiopulmonary processes. Basic blood work reveals unremarkable CBC and lactic acid. UA negative for infection. Has hypokalemia without hypomagnesemia. Repleted with oral potassium chloride. Given IV fluids and Toradol with good symptomatic relief. EKG with sinus rhythm and no ischemic changes. No evidence of myocarditis or pericarditis. Had negative influenza testing at her PCP's office earlier this week. Discussed with her primary care provider results, who has agreed to follow-up with her in 1 day for repeat exam and evaluation. Discussed with patient regarding supportive care, but to return for persistent or worsening symptoms. Strict return and follow-up instructions reviewed. She expressed understanding of all discharge instructions and felt comfortable with the plan of care.    I personally performed the  services described in this documentation, which was scribed in my presence. The recorded information has been reviewed and is accurate.   Lavera Guise, MD 03/19/15 [phone]

## 2015-03-18 NOTE — Discharge Instructions (Signed)
°  Return for worsening symptoms, including difficulty breathing, worsening pain, vomiting and unable to keep down food/fluids, or any other symptoms concerning to you. Please call your PCP tomorrow to update on symptoms.   Pleurisy Pleurisy is redness, puffiness (swelling), and soreness (inflammation) of the lining of the lungs. It can be hard to breathe and hurt to breathe. Coughing or deep breathing will make it hurt more. It is often caused by an existing infection or disease.  HOME CARE  Only take medicine as told by your doctor.  Only take antibiotic medicine as directed. Make sure to finish it even if you start to feel better. GET HELP RIGHT AWAY IF:   Your lips, fingernails, or toenails are blue or dark.  You cough up blood.  You have a hard time breathing.  Your pain is not controlled with medicine or it lasts for more than 1 week.  Your pain spreads (radiates) into your neck, arms, or jaw.  You are short of breath or wheezing.  You develop a fever, rash, throw up (vomit), or faint. MAKE SURE YOU:   Understand these instructions.  Will watch your condition.  Will get help right away if you are not doing well or get worse.   This information is not intended to replace advice given to you by your health care provider. Make sure you discuss any questions you have with your health care provider.   Document Released: 04/08/2008 Document Revised: 12/27/2012 Document Reviewed: 10/08/2012 Elsevier Interactive Patient Education [employer]2016 Elsevier Inc.

## 2015-03-18 NOTE — ED Notes (Signed)
Pt states she began having high fevers last week in addition to body aches and non-productive cough. Pt was seen by PCP and tested negative for the flu (pt has had flu shot). Pt was treated for pneumonia by PCP on Saturday. Pt was given 1gm Rocephin IM and a z-pack. Pt has not had a chest x-ray/    RN who gave report was Arlyss RepressAlyssa, PA at Dr. Jeannette HowHoward's office in [place].

## 2015-03-18 NOTE — ED Notes (Signed)
Pt states she has been having emesis starting this morning. 10 episodes.

## 2015-05-14 DIAGNOSIS — H5213 Myopia, bilateral: Secondary | ICD-10-CM | POA: Diagnosis not present

## 2015-08-22 ENCOUNTER — Telehealth: Payer: 59 | Admitting: Family

## 2015-08-22 DIAGNOSIS — J329 Chronic sinusitis, unspecified: Secondary | ICD-10-CM

## 2015-08-22 DIAGNOSIS — B9689 Other specified bacterial agents as the cause of diseases classified elsewhere: Secondary | ICD-10-CM

## 2015-08-22 DIAGNOSIS — A499 Bacterial infection, unspecified: Secondary | ICD-10-CM

## 2015-08-22 MED ORDER — AMOXICILLIN-POT CLAVULANATE 875-125 MG PO TABS: 1.0000 | ORAL_TABLET | Freq: Two times a day (BID) | ORAL | Status: AC

## 2015-08-22 NOTE — Progress Notes (Signed)

## 2015-12-26 DIAGNOSIS — Z6826 Body mass index (BMI) 26.0-26.9, adult: Secondary | ICD-10-CM | POA: Diagnosis not present

## 2015-12-26 DIAGNOSIS — S39012A Strain of muscle, fascia and tendon of lower back, initial encounter: Secondary | ICD-10-CM | POA: Diagnosis not present

## 2016-01-05 DIAGNOSIS — R51 Headache: Secondary | ICD-10-CM | POA: Diagnosis not present

## 2016-01-05 DIAGNOSIS — Z6826 Body mass index (BMI) 26.0-26.9, adult: Secondary | ICD-10-CM | POA: Diagnosis not present

## 2016-01-05 DIAGNOSIS — R509 Fever, unspecified: Secondary | ICD-10-CM | POA: Diagnosis not present

## 2016-01-29 DIAGNOSIS — Z6825 Body mass index (BMI) 25.0-25.9, adult: Secondary | ICD-10-CM | POA: Diagnosis not present

## 2016-01-29 DIAGNOSIS — Z113 Encounter for screening for infections with a predominantly sexual mode of transmission: Secondary | ICD-10-CM | POA: Diagnosis not present

## 2016-01-29 DIAGNOSIS — Z01419 Encounter for gynecological examination (general) (routine) without abnormal findings: Secondary | ICD-10-CM | POA: Diagnosis not present

## 2016-05-29 DIAGNOSIS — J020 Streptococcal pharyngitis: Secondary | ICD-10-CM | POA: Diagnosis not present

## 2016-05-29 DIAGNOSIS — B084 Enteroviral vesicular stomatitis with exanthem: Secondary | ICD-10-CM | POA: Diagnosis not present

## 2016-05-29 DIAGNOSIS — Z6826 Body mass index (BMI) 26.0-26.9, adult: Secondary | ICD-10-CM | POA: Diagnosis not present

## 2016-09-01 DIAGNOSIS — H5213 Myopia, bilateral: Secondary | ICD-10-CM | POA: Diagnosis not present

## 2016-12-04 ENCOUNTER — Telehealth: Payer: 59 | Admitting: Physician Assistant

## 2016-12-04 DIAGNOSIS — J029 Acute pharyngitis, unspecified: Secondary | ICD-10-CM

## 2016-12-04 NOTE — Progress Notes (Signed)
Based on what you shared with me it looks like you have a condition that should be evaluated in a face to face office visit. We cannot diagnose/treat strep throat via e-visit. You need a physical examination and strep testing to determine if strep is present or if there is another cause of symptoms, as the choice of antibiotics and other treatment are different.  NOTE: Even if you have entered your credit card information for this eVisit, you will not be charged.   If you are having a true medical emergency please call 911.  If you need an urgent face to face visit, [facility] has four urgent care centers for your convenience.  If you need care fast and have a high deductible or no insurance consider:   [email or web address]  [phone]  [address], [address] [place], [place] [address] 8 am to 8 pm Monday-Friday 10 am to 4 pm Saturday-Sunday   The following sites will take your  insurance:    . [facility]  [phone] Get Driving Directions Find a Provider at this Location  [address] [place], [place] [address] . 10 am to 8 pm Monday-Friday . 12 pm to 8 pm Saturday-Sunday   . [facility]  [phone] Get Driving Directions Find a Provider at this Location  [address], [address] [place], [place] [address] . 8 am to 8 pm Monday-Friday . 9 am to 6 pm Saturday . 11 am to 6 pm Sunday   . [facility] [place]  [phone] Get Driving Directions  [address] [place], [place] [address] . 8 am to 8 pm Monday-Friday . 8 am to 4 pm Saturday-Sunday   Your e-visit answers were reviewed by a board certified advanced clinical practitioner to complete your personal care plan.  Thank you for using e-Visits.

## 2017-01-13 IMAGING — DX DG CHEST 2V
2 series · 2 of 2 positions shown · non-contrast
Comparison: None.

CLINICAL DATA: 24-year-old female with high fevers and
nonproductive cough for 1 week. Recent negative flu test. Initial
encounter.

EXAM:
CHEST  2 VIEW

[chest pa]
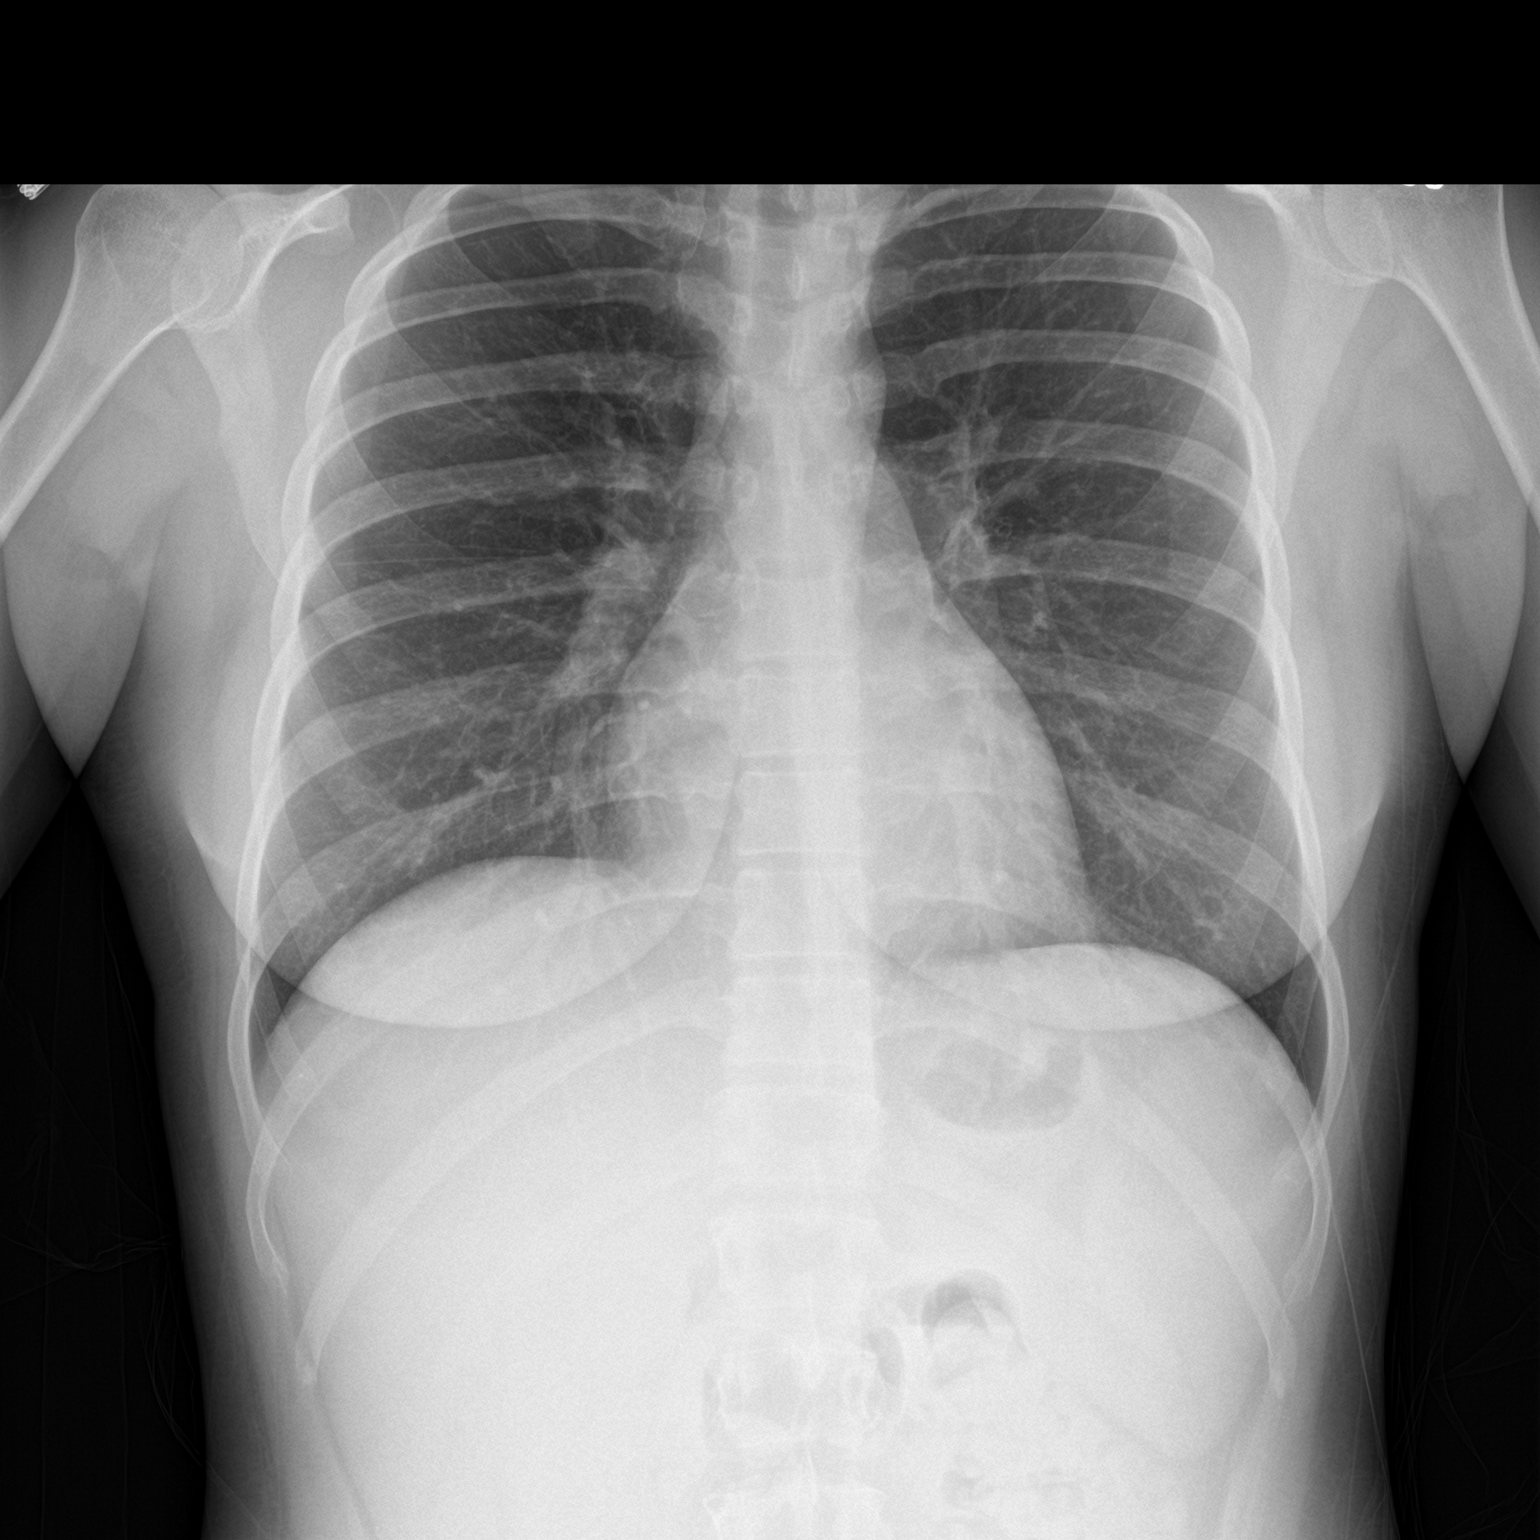

[chest lat]
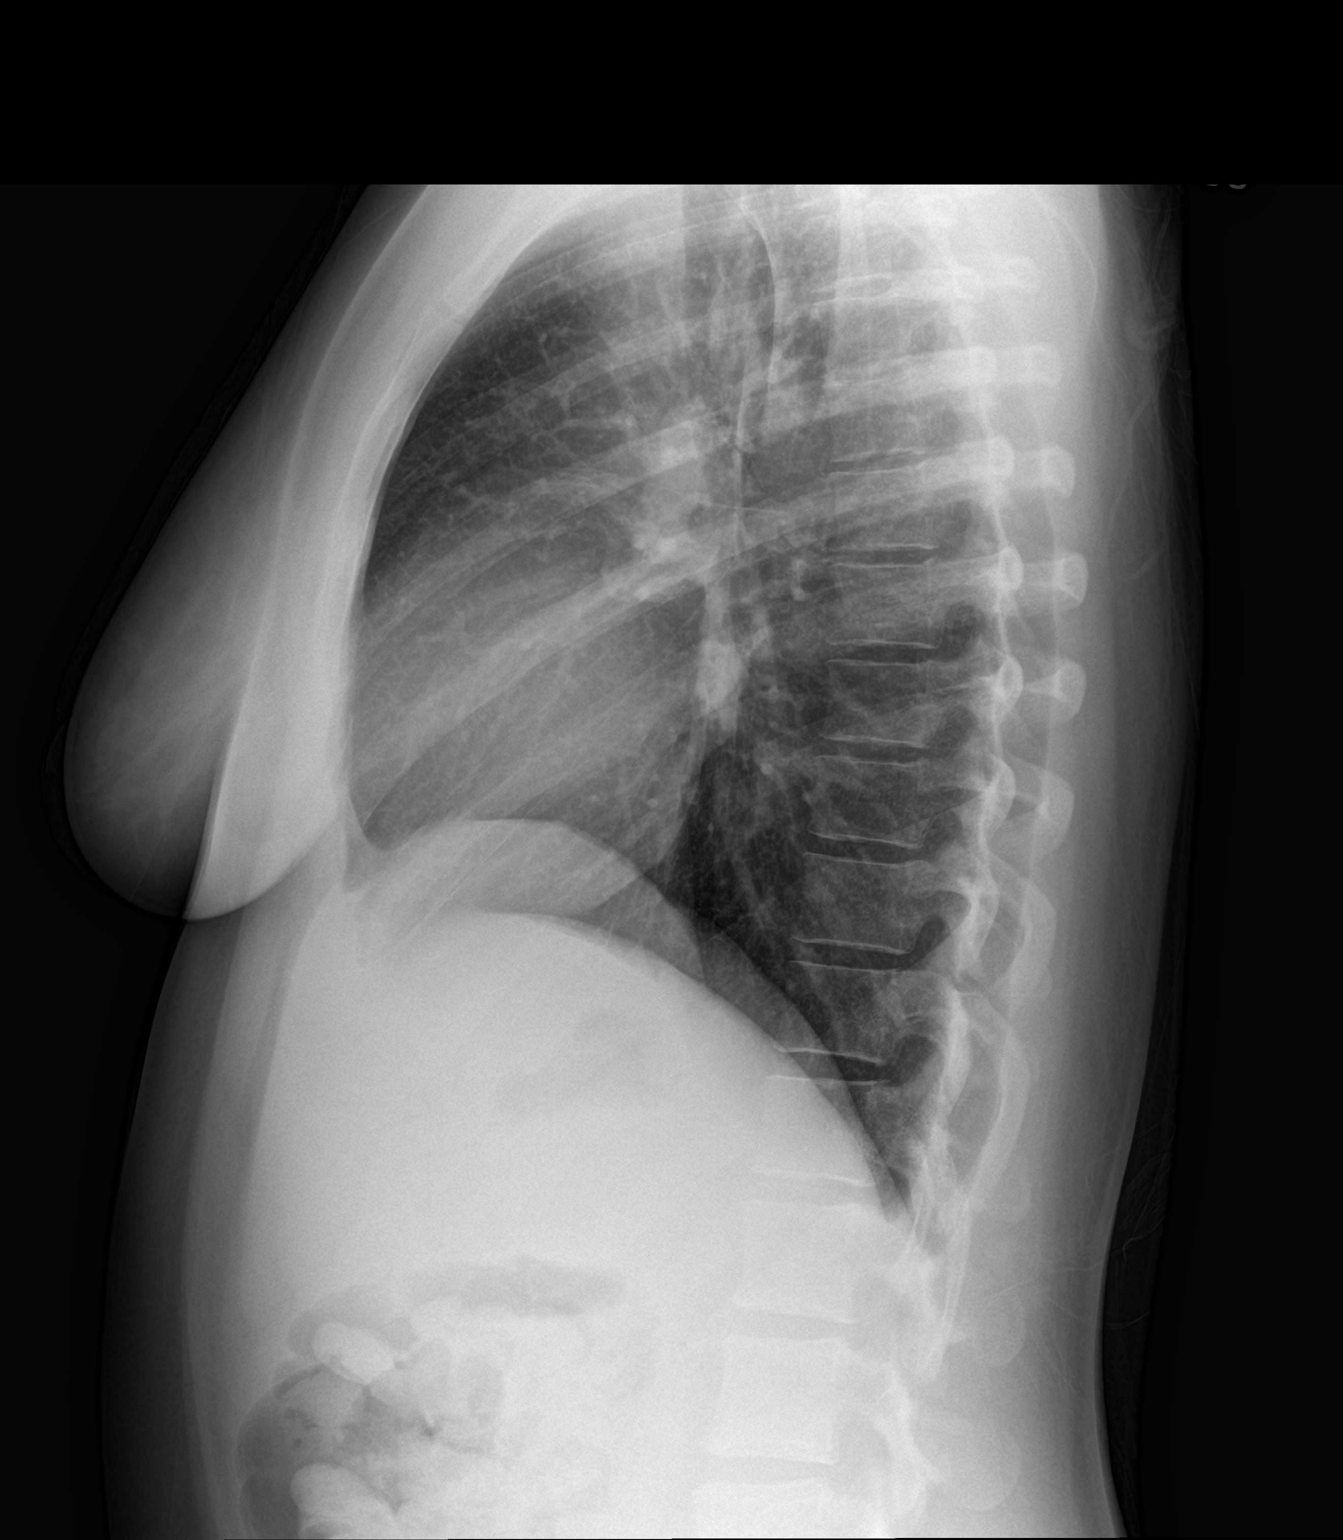

[2 of 2 positions shown; findings below may reference images not displayed]

FINDINGS: Normal lung volumes. Cardiac size at the upper limits of normal.
Other mediastinal contours are within normal limits. Visualized
tracheal air column is within normal limits. The lungs are clear. No
pneumothorax or effusion. Negative visible bowel gas pattern. No
osseous abnormality identified.
IMPRESSION: Negative, no acute cardiopulmonary abnormality.

## 2017-03-01 DIAGNOSIS — Z6827 Body mass index (BMI) 27.0-27.9, adult: Secondary | ICD-10-CM | POA: Diagnosis not present

## 2017-03-01 DIAGNOSIS — Z01419 Encounter for gynecological examination (general) (routine) without abnormal findings: Secondary | ICD-10-CM | POA: Diagnosis not present

## 2018-01-17 ENCOUNTER — Other Ambulatory Visit: Payer: Self-pay

## 2018-01-17 ENCOUNTER — Emergency Department (HOSPITAL_COMMUNITY)
Admission: EM | Admit: 2018-01-17 | Discharge: 2018-01-18 | Disposition: A | Discharge status: Home / Self Care | Payer: 59 | Attending: Emergency Medicine | Admitting: Emergency Medicine

## 2018-01-17 ENCOUNTER — Encounter (HOSPITAL_COMMUNITY): Payer: Self-pay | Admitting: Emergency Medicine

## 2018-01-17 DIAGNOSIS — S20219A Contusion of unspecified front wall of thorax, initial encounter: Secondary | ICD-10-CM | POA: Insufficient documentation

## 2018-01-17 DIAGNOSIS — Y999 Unspecified external cause status: Secondary | ICD-10-CM | POA: Diagnosis not present

## 2018-01-17 DIAGNOSIS — S2020XA Contusion of thorax, unspecified, initial encounter: Secondary | ICD-10-CM

## 2018-01-17 DIAGNOSIS — Y92410 Unspecified street and highway as the place of occurrence of the external cause: Secondary | ICD-10-CM | POA: Diagnosis not present

## 2018-01-17 DIAGNOSIS — Y9389 Activity, other specified: Secondary | ICD-10-CM | POA: Diagnosis not present
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
## 2018-01-18 MED ORDER — METHOCARBAMOL 500 MG PO TABS: 500.0000 mg | ORAL_TABLET | Freq: Three times a day (TID) | ORAL | 0 refills | Status: DC

## 2018-01-18 MED FILL — METHOCARBAMOL 500 MG TABS: 500 | 7 days supply | Qty: 21 | Fill #0

## 2018-01-18 NOTE — Discharge Instructions (Addendum)
Your vital signs are within normal limits.  Your examination is negative for acute changes at this time.  You can expect to be sore in various areas over the next couple of days.  Please use Tylenol every 4 hours, or ibuprofen every 6 hours.  Please use Robaxin for spasm pain.  Robaxin may cause drowsiness.  Please do not drive a vehicle, operate machinery, or participate in activities requiring concentration when taking this medication.  Please see your primary physician or return to the emergency department if any changes in condition, problems, or concerns.
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
## 2018-03-20 DIAGNOSIS — Z01419 Encounter for gynecological examination (general) (routine) without abnormal findings: Secondary | ICD-10-CM | POA: Diagnosis not present

## 2018-03-20 DIAGNOSIS — Z6825 Body mass index (BMI) 25.0-25.9, adult: Secondary | ICD-10-CM | POA: Diagnosis not present

## 2018-03-21 DIAGNOSIS — H5213 Myopia, bilateral: Secondary | ICD-10-CM | POA: Diagnosis not present

## 2018-04-04 DIAGNOSIS — Z6825 Body mass index (BMI) 25.0-25.9, adult: Secondary | ICD-10-CM | POA: Diagnosis not present

## 2018-04-04 DIAGNOSIS — J020 Streptococcal pharyngitis: Secondary | ICD-10-CM | POA: Diagnosis not present

## 2018-12-12 DIAGNOSIS — Z6824 Body mass index (BMI) 24.0-24.9, adult: Secondary | ICD-10-CM | POA: Diagnosis not present

## 2018-12-12 DIAGNOSIS — G43909 Migraine, unspecified, not intractable, without status migrainosus: Secondary | ICD-10-CM | POA: Diagnosis not present

## 2018-12-12 DIAGNOSIS — F419 Anxiety disorder, unspecified: Secondary | ICD-10-CM | POA: Diagnosis not present

## 2018-12-12 MED FILL — ESCITALOPRAM 5 MG TABLET: 5 | 30 days supply | Qty: 30 | Fill #0

## 2018-12-12 MED FILL — RIZATRIPTAN BENZOATE 10 MG: 10 | 30 days supply | Qty: 9 | Fill #0

## 2019-01-10 MED FILL — ESCITALOPRAM 5 MG TABLET: 5 | 30 days supply | Qty: 30 | Fill #0

## 2019-02-15 DIAGNOSIS — F419 Anxiety disorder, unspecified: Secondary | ICD-10-CM | POA: Diagnosis not present

## 2019-02-15 DIAGNOSIS — D649 Anemia, unspecified: Secondary | ICD-10-CM | POA: Diagnosis not present

## 2019-02-15 DIAGNOSIS — Z6824 Body mass index (BMI) 24.0-24.9, adult: Secondary | ICD-10-CM | POA: Diagnosis not present

## 2019-02-15 DIAGNOSIS — G43909 Migraine, unspecified, not intractable, without status migrainosus: Secondary | ICD-10-CM | POA: Diagnosis not present

## 2019-02-15 MED FILL — RIZATRIPTAN BENZOATE 10 MG: 10 | 30 days supply | Qty: 9 | Fill #0

## 2019-02-15 MED FILL — ESCITALOPRAM 5 MG TABLET: 5 | 30 days supply | Qty: 30 | Fill #0

## 2019-03-21 MED FILL — ESCITALOPRAM 5 MG TABLET: 5 | 30 days supply | Qty: 30 | Fill #1

## 2019-03-28 DIAGNOSIS — H5213 Myopia, bilateral: Secondary | ICD-10-CM | POA: Diagnosis not present

## 2019-05-10 MED FILL — ESCITALOPRAM 5 MG TABLET: 5 | 30 days supply | Qty: 30 | Fill #2

## 2019-05-18 DIAGNOSIS — Z6824 Body mass index (BMI) 24.0-24.9, adult: Secondary | ICD-10-CM | POA: Diagnosis not present

## 2019-05-18 DIAGNOSIS — G43909 Migraine, unspecified, not intractable, without status migrainosus: Secondary | ICD-10-CM | POA: Diagnosis not present

## 2019-05-18 DIAGNOSIS — D649 Anemia, unspecified: Secondary | ICD-10-CM | POA: Diagnosis not present

## 2019-05-18 DIAGNOSIS — F419 Anxiety disorder, unspecified: Secondary | ICD-10-CM | POA: Diagnosis not present

## 2019-05-18 MED FILL — RIZATRIPTAN BENZOATE 10 MG: 10 | 30 days supply | Qty: 9 | Fill #0

## 2019-06-22 MED FILL — ESCITALOPRAM 5 MG TABLET: 5 | 30 days supply | Qty: 30 | Fill #0

## 2019-08-07 MED FILL — ESCITALOPRAM 5 MG TABLET: 5 | 30 days supply | Qty: 30 | Fill #1

## 2019-10-03 DIAGNOSIS — G43909 Migraine, unspecified, not intractable, without status migrainosus: Secondary | ICD-10-CM | POA: Diagnosis not present

## 2019-10-03 DIAGNOSIS — Z6824 Body mass index (BMI) 24.0-24.9, adult: Secondary | ICD-10-CM | POA: Diagnosis not present

## 2019-10-03 DIAGNOSIS — F419 Anxiety disorder, unspecified: Secondary | ICD-10-CM | POA: Diagnosis not present

## 2019-10-03 MED FILL — ESCITALOPRAM 10 MG TABLET: 10 | 30 days supply | Qty: 30 | Fill #0

## 2019-11-15 MED FILL — ESCITALOPRAM 10 MG TABLET: 10 | 30 days supply | Qty: 30 | Fill #1

## 2019-12-17 MED FILL — ESCITALOPRAM 10 MG TABLET: 10 | 30 days supply | Qty: 30 | Fill #2

## 2020-01-04 DIAGNOSIS — Z6822 Body mass index (BMI) 22.0-22.9, adult: Secondary | ICD-10-CM | POA: Diagnosis not present

## 2020-01-04 DIAGNOSIS — Z01419 Encounter for gynecological examination (general) (routine) without abnormal findings: Secondary | ICD-10-CM | POA: Diagnosis not present

## 2020-01-21 ENCOUNTER — Other Ambulatory Visit (HOSPITAL_COMMUNITY): Payer: Self-pay | Admitting: Physician Assistant

## 2020-01-21 MED FILL — ESCITALOPRAM 10 MG TABLET: 10 | 30 days supply | Qty: 30 | Fill #0

## 2020-02-27 MED FILL — ESCITALOPRAM 10 MG TABLET: 10 | 30 days supply | Qty: 30 | Fill #1

## 2020-03-28 DIAGNOSIS — H5213 Myopia, bilateral: Secondary | ICD-10-CM | POA: Diagnosis not present

## 2020-05-08 ENCOUNTER — Ambulatory Visit: Payer: 59 | Attending: Internal Medicine

## 2020-05-08 DIAGNOSIS — Z23 Encounter for immunization: Secondary | ICD-10-CM

## 2020-05-08 NOTE — Progress Notes (Signed)
   Covid-19 Vaccination Clinic  Name:  Pamela Mcintosh    MRN: [id number] DOB: [birth year]  05/08/2020  [name] was observed post Covid-19 immunization for 15 minutes without incident. She was provided with Vaccine Information Sheet and instruction to access the V-Safe system.   [name] was instructed to call 911 with any severe reactions post vaccine: Marland Kitchen Difficulty breathing  . Swelling of face and throat  . A fast heartbeat  . A bad rash all over body  . Dizziness and weakness   Immunizations Administered    Name Date Dose VIS Date Route   Pfizer COVID-19 Vaccine 05/08/2020  3:51 PM 0.3 mL 02/27/2020 Intramuscular   Manufacturer: [employer], [employer]   Lot: [id number]   NDC: [address]-7

## 2020-09-25 ENCOUNTER — Other Ambulatory Visit (HOSPITAL_COMMUNITY): Payer: Self-pay

## 2020-09-25 MED FILL — Escitalopram Oxalate Tab 10 MG (Base Equiv): ORAL | 30 days supply | Qty: 30 | Fill #0 | Status: AC

## 2020-09-26 ENCOUNTER — Other Ambulatory Visit (HOSPITAL_COMMUNITY): Payer: Self-pay

## 2020-09-30 ENCOUNTER — Other Ambulatory Visit (HOSPITAL_COMMUNITY): Payer: Self-pay

## 2020-10-01 ENCOUNTER — Other Ambulatory Visit (HOSPITAL_COMMUNITY): Payer: Self-pay

## 2020-10-09 ENCOUNTER — Other Ambulatory Visit (HOSPITAL_COMMUNITY): Payer: Self-pay

## 2020-10-21 ENCOUNTER — Other Ambulatory Visit (HOSPITAL_COMMUNITY): Payer: Self-pay

## 2020-10-21 DIAGNOSIS — Z6823 Body mass index (BMI) 23.0-23.9, adult: Secondary | ICD-10-CM | POA: Diagnosis not present

## 2020-10-21 DIAGNOSIS — G43909 Migraine, unspecified, not intractable, without status migrainosus: Secondary | ICD-10-CM | POA: Diagnosis not present

## 2020-10-21 DIAGNOSIS — F419 Anxiety disorder, unspecified: Secondary | ICD-10-CM | POA: Diagnosis not present

## 2020-10-21 MED ORDER — ESCITALOPRAM OXALATE 10 MG PO TABS
10.0000 mg | ORAL_TABLET | Freq: Every day | ORAL | 0 refills | Status: DC
  Filled 2020-10-21: qty 90, 90d supply, fill #0

## 2020-10-21 MED ORDER — RIZATRIPTAN BENZOATE 10 MG PO TABS
10.0000 mg | ORAL_TABLET | ORAL | 2 refills | Status: AC
  Filled 2020-10-21: qty 9, 30d supply, fill #0

## 2020-10-21 MED ORDER — VALACYCLOVIR HCL 1 G PO TABS
2000.0000 mg | ORAL_TABLET | Freq: Two times a day (BID) | ORAL | 2 refills | Status: DC
  Filled 2020-10-21: qty 4, 1d supply, fill #0
  Filled 2021-03-04: qty 4, 1d supply, fill #1
  Filled 2021-03-10: qty 4, 1d supply, fill #2

## 2021-03-04 ENCOUNTER — Other Ambulatory Visit (HOSPITAL_COMMUNITY): Payer: Self-pay

## 2021-03-04 MED ORDER — ESCITALOPRAM OXALATE 10 MG PO TABS
10.0000 mg | ORAL_TABLET | Freq: Every day | ORAL | 0 refills | Status: DC
  Filled 2021-03-04: qty 30, 30d supply, fill #0

## 2021-03-10 ENCOUNTER — Other Ambulatory Visit (HOSPITAL_COMMUNITY): Payer: Self-pay

## 2021-03-25 ENCOUNTER — Telehealth: Payer: 59 | Admitting: Nurse Practitioner

## 2021-03-25 ENCOUNTER — Other Ambulatory Visit (HOSPITAL_COMMUNITY): Payer: Self-pay

## 2021-03-25 DIAGNOSIS — R051 Acute cough: Secondary | ICD-10-CM

## 2021-03-25 MED ORDER — PREDNISONE 20 MG PO TABS
40.0000 mg | ORAL_TABLET | Freq: Every day | ORAL | 0 refills | Status: AC
  Filled 2021-03-25: qty 10, 5d supply, fill #0

## 2021-03-25 MED ORDER — PROMETHAZINE-DM 6.25-15 MG/5ML PO SYRP
5.0000 mL | ORAL_SOLUTION | Freq: Four times a day (QID) | ORAL | 0 refills | Status: DC | PRN
  Filled 2021-03-25: qty 118, 6d supply, fill #0

## 2021-03-25 NOTE — Progress Notes (Signed)
Virtual Visit Consent   Pamela Mcintosh, you are scheduled for a virtual visit with Mary-Margaret Hassell Done, [place], a [facility] provider, today.     Just as with appointments in the office, your consent must be obtained to participate.  Your consent will be active for this visit and any virtual visit you may have with one of our providers in the next 365 days.     If you have a MyChart account, a copy of this consent can be sent to you electronically.  All virtual visits are billed to your insurance company just like a traditional visit in the office.    As this is a virtual visit, video technology does not allow for your provider to perform a traditional examination.  This may limit your provider's ability to fully assess your condition.  If your provider identifies any concerns that need to be evaluated in person or the need to arrange testing (such as labs, EKG, etc.), we will make arrangements to do so.     Although advances in technology are sophisticated, we cannot ensure that it will always work on either your end or our end.  If the connection with a video visit is poor, the visit may have to be switched to a telephone visit.  With either a video or telephone visit, we are not always able to ensure that we have a secure connection.     I need to obtain your verbal consent now.   Are you willing to proceed with your visit today? YES   Pamela Mcintosh has provided verbal consent on 03/25/2021 for a virtual visit (video or telephone).   Mary-Margaret Hassell Done, FNP   Date: 03/25/2021 9:44 AM   Virtual Visit via Video Note   I, Mary-Margaret Hassell Done, connected with Pamela Mcintosh ([birth year], Sep 25, 1990) on 03/25/21 at  9:45 AM EST by a video-enabled telemedicine application and verified that I am speaking with the correct person using two identifiers.  Location: Patient: Virtual Visit Location Patient: Other: work Provider: Ecologist: Mobile   I discussed the  limitations of evaluation and management by telemedicine and the availability of in person appointments. The patient expressed understanding and agreed to proceed.    History of Present Illness: Pamela Mcintosh is a 30 y.o. who identifies as a female who was assigned female at birth, and is being seen today for cough.  HPI: Patient developed a cough Sunday evening. She has alow grade fever. She has been taking OTC cough meds and tylem=nol cold and flu. Has helped some.   Review of Systems  Constitutional:  Positive for fever (low grade) and malaise/fatigue. Negative for chills.  HENT:  Positive for congestion. Negative for sore throat.   Respiratory:  Positive for cough and sputum production.   Musculoskeletal:  Negative for myalgias.  Neurological:  Negative for dizziness and headaches.   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   HEADACHE 02/13/2008    Allergies:  Allergies  Allergen Reactions   Morphine And Related     itching   Medications:  Current Outpatient Medications:    escitalopram (LEXAPRO) 10 MG tablet, Take 1 tablet (10 mg total) by mouth daily., Disp: 30 tablet, Rfl: 0  ibuprofen (ADVIL,MOTRIN) 600 MG tablet, Take 1 tablet (600 mg total) by mouth every 6 (six) hours as needed for mild pain., Disp: 30 tablet, Rfl: 0   methocarbamol (ROBAXIN) 500 MG tablet, Take 1 tablet (500 mg total) by mouth 3 (three) times daily., Disp: 21 tablet, Rfl: 0   oxyCODONE-acetaminophen (PERCOCET/ROXICET) 5-325 MG per tablet, Take 1-2 tablets by mouth every 4 (four) hours as needed (for pain scale greater than 7)., Disp: 30 tablet, Rfl: 0   Prenatal Vit-Fe Fumarate-FA (MULTIVITAMIN-PRENATAL) 27-0.8 MG TABS tablet, Take  1 tablet by mouth daily at 12 noon., Disp: , Rfl:    rizatriptan (MAXALT) 10 MG tablet, Take 1 tablet (10 mg total) by mouth as directed., Disp: 9 tablet, Rfl: 2   valACYclovir (VALTREX) 1000 MG tablet, Take 2 tablets (2,000 mg total) by mouth 2 (two) times daily., Disp: 4 tablet, Rfl: 2  Observations/Objective: Patient is well-developed, well-nourished in no acute distress.  Resting comfortably  at home.  Head is normocephalic, atraumatic.  No labored breathing.  Speech is clear and coherent with logical content.  Paient is alert and oriented at baseline.    Assessment and Plan: [name] in today with chief complaint of No chief complaint on file.   1. Acute cough 1. Take meds as prescribed 2. Use a cool mist humidifier especially during the winter months and when heat has been humid. 3. Use saline nose sprays frequently 4. Saline irrigations of the nose can be very helpful if done frequently.  * 4X daily for 1 week*  * Use of a nettie pot can be helpful with this. Follow directions with this* 5. Drink plenty of fluids 6. Keep thermostat turn down low 7.For any cough or congestion  Use plain Mucinex- regular strength or max strength is fine   * Children- consult with Pharmacist for dosing 8. For fever or aces or pains- take tylenol or ibuprofen appropriate for age and weight.  * for fevers greater than 101 orally you may alternate ibuprofen and tylenol every  3 hours. Meds ordered this encounter  Medications   predniSONE (DELTASONE) 20 MG tablet    Sig: Take 2 tablets (40 mg total) by mouth daily with breakfast for 5 days. 2 po daily for 5 days    Dispense:  10 tablet    Refill:  0    Order Specific Question:   Supervising Provider    Answer:   Eber Hong [[id number]]   promethazine-dextromethorphan (PROMETHAZINE-DM) 6.25-15 MG/5ML syrup    Sig: Take 5 mLs by mouth 4 (four) times daily as needed for cough.    Dispense:  118 mL    Refill:  0    Order Specific Question:    Supervising Provider    Answer:   Eber Hong [[id number]]       Follow Up Instructions: I discussed the assessment and treatment plan with the patient. The patient was provided an opportunity to ask questions and all were answered. The patient agreed with the plan and demonstrated an understanding of the instructions.  A copy of instructions were sent to the patient via MyChart.  The patient was advised to call back or seek an in-person evaluation if the symptoms worsen or if the condition fails to improve as anticipated.  Time:  I spent 11 minutes with the patient via telehealth technology discussing the above problems/concerns.    Mary-Margaret Daphine Deutscher, FNP

## 2021-03-25 NOTE — Patient Instructions (Signed)

## 2021-04-13 DIAGNOSIS — H5213 Myopia, bilateral: Secondary | ICD-10-CM | POA: Diagnosis not present

## 2022-03-24 ENCOUNTER — Other Ambulatory Visit (HOSPITAL_COMMUNITY): Payer: Self-pay

## 2022-03-24 ENCOUNTER — Telehealth: Payer: Self-pay | Admitting: Physician Assistant

## 2022-03-24 DIAGNOSIS — B001 Herpesviral vesicular dermatitis: Secondary | ICD-10-CM

## 2022-03-24 MED ORDER — VALACYCLOVIR HCL 1 G PO TABS
2000.0000 mg | ORAL_TABLET | Freq: Two times a day (BID) | ORAL | 0 refills | Status: AC
  Filled 2022-03-24 – 2022-12-06 (×2): qty 4, 1d supply, fill #0

## 2022-03-24 MED ORDER — VALACYCLOVIR HCL 1 G PO TABS
2000.0000 mg | ORAL_TABLET | Freq: Two times a day (BID) | ORAL | 0 refills | Status: AC
  Filled 2022-03-24: qty 4, 1d supply, fill #0

## 2022-03-24 NOTE — Progress Notes (Signed)
We are sorry that you are not feeling well.  Here is how we plan to help!  Based on what you have shared with me it does look like you have a viral infection.    Most cold sores or fever blisters are small fluid filled blisters around the mouth caused by herpes simplex virus.  The most common strain of the virus causing cold sores is herpes simplex virus 1.  It can be spread by skin contact, sharing eating utensils, or even sharing towels.  Cold sores are contagious to other people until dry. (Approximately 5-7 days).  Wash your hands. You can spread the virus to your eyes through handling your contact lenses after touching the lesions.  Most people experience pain at the sight or tingling sensations in their lips that may begin before the ulcers erupt.  Herpes simplex is treatable but not curable.  It may lie dormant for a long time and then reappear due to stress or prolonged sun exposure.  Many patients have success in treating their cold sores with an over the counter topical called Abreva.  You may apply the cream up to 5 times daily (maximum 10 days) until healing occurs.  If you would like to use an oral antiviral medication to speed the healing of your cold sore, I have sent a prescription to your local pharmacy Valacyclovir 2 gm take one by mouth twice a day for 1 day. After 4872 hours from symptom onset, antivirals are less effective, but giving your prolonged course of symptoms, I feel it is reasonable to still use an antiviral.     HOME CARE:  Wash your hands frequently. Do not pick at or rub the sore. Don't open the blisters. Avoid kissing other people during this time. Avoid sharing drinking glasses, eating utensils, or razors. Do not handle contact lenses unless you have thoroughly washed your hands with soap and warm water! Avoid oral sex during this time.  Herpes from sores on your mouth can spread to your partner's genital area. Avoid contact with anyone who has eczema or a  weakened immune system. Cold sores are often triggered by exposure to intense sunlight, use a lip balm containing a sunscreen (SPF 30 or higher).  GET HELP RIGHT AWAY IF:  Blisters look infected. Blisters occur near or in the eye. Symptoms last longer than 10 days. Your symptoms become worse.  MAKE SURE YOU:  Understand these instructions. Will watch your condition. Will get help right away if you are not doing well or get worse.    Your e-visit answers were reviewed by a board certified advanced clinical practitioner to complete your personal care plan.  Depending upon the condition, your plan could have  Included both over the counter or prescription medications.    Please review your pharmacy choice.  Be sure that the pharmacy you have chosen is open so that you can pick up your prescription now.  If there is a problem you can message your provider in MyChart to have the prescription routed to another pharmacy.    Your safety is important to [place].  If you have drug allergies check our prescription carefully.  For the next 24 hours you can use MyChart to ask questions about today's visit, request a non-urgent call back, or ask for a work or school excuse from your e-visit provider.  You will get an email in the next two days asking about your experience.  I hope that your e-visit has been valuable and  will speed your recovery.

## 2022-03-24 NOTE — Progress Notes (Signed)
I have spent 5 minutes in review of e-visit questionnaire, review and updating patient chart, medical decision making and response to patient.   Devra Stare Cody Jannelle Notaro, PA-C    

## 2022-10-06 ENCOUNTER — Other Ambulatory Visit (HOSPITAL_COMMUNITY): Payer: Self-pay

## 2022-10-08 ENCOUNTER — Other Ambulatory Visit (HOSPITAL_COMMUNITY): Payer: Self-pay

## 2022-12-06 ENCOUNTER — Other Ambulatory Visit (HOSPITAL_COMMUNITY): Payer: Self-pay

## 2022-12-10 ENCOUNTER — Other Ambulatory Visit (HOSPITAL_COMMUNITY): Payer: Self-pay

## 2023-01-07 DIAGNOSIS — Z6825 Body mass index (BMI) 25.0-25.9, adult: Secondary | ICD-10-CM | POA: Diagnosis not present

## 2023-01-07 DIAGNOSIS — R030 Elevated blood-pressure reading, without diagnosis of hypertension: Secondary | ICD-10-CM | POA: Diagnosis not present

## 2023-01-07 DIAGNOSIS — Z0000 Encounter for general adult medical examination without abnormal findings: Secondary | ICD-10-CM | POA: Diagnosis not present

## 2023-01-07 DIAGNOSIS — B001 Herpesviral vesicular dermatitis: Secondary | ICD-10-CM | POA: Diagnosis not present

## 2023-01-07 DIAGNOSIS — G43909 Migraine, unspecified, not intractable, without status migrainosus: Secondary | ICD-10-CM | POA: Diagnosis not present

## 2023-06-12 ENCOUNTER — Telehealth: Payer: Commercial Managed Care - PPO | Admitting: Nurse Practitioner

## 2023-06-12 DIAGNOSIS — J069 Acute upper respiratory infection, unspecified: Secondary | ICD-10-CM

## 2023-06-12 MED ORDER — FLUTICASONE PROPIONATE 50 MCG/ACT NA SUSP: 2.0000 | Freq: Every day | NASAL | 0 refills | Status: AC

## 2023-06-12 NOTE — Progress Notes (Signed)

## 2023-06-12 NOTE — Progress Notes (Signed)
 I have spent 5 minutes in review of e-visit questionnaire, review and updating patient chart, medical decision making and response to patient.   Claiborne Rigg, NP

## 2023-07-19 ENCOUNTER — Other Ambulatory Visit (HOSPITAL_COMMUNITY): Payer: Self-pay

## 2023-07-19 MED ORDER — METHYLPHENIDATE HCL ER (OSM) 18 MG PO TBCR
18.0000 mg | EXTENDED_RELEASE_TABLET | Freq: Every morning | ORAL | 0 refills | Status: AC
  Filled 2023-07-19: qty 30, 30d supply, fill #0

## 2023-08-19 ENCOUNTER — Other Ambulatory Visit (HOSPITAL_COMMUNITY): Payer: Self-pay

## 2023-08-19 MED ORDER — METHYLPHENIDATE HCL ER (OSM) 27 MG PO TBCR
27.0000 mg | EXTENDED_RELEASE_TABLET | ORAL | 0 refills | Status: AC
  Filled 2023-10-20: qty 30, 30d supply, fill #0

## 2023-08-19 MED ORDER — METHYLPHENIDATE HCL ER (OSM) 27 MG PO TBCR
27.0000 mg | EXTENDED_RELEASE_TABLET | ORAL | 0 refills | Status: AC
  Filled 2023-09-19: qty 30, 30d supply, fill #0

## 2023-08-19 MED ORDER — METHYLPHENIDATE HCL ER (OSM) 27 MG PO TBCR
27.0000 mg | EXTENDED_RELEASE_TABLET | ORAL | 0 refills | Status: AC
  Filled 2023-08-19: qty 30, 30d supply, fill #0

## 2023-09-19 ENCOUNTER — Other Ambulatory Visit (HOSPITAL_COMMUNITY): Payer: Self-pay

## 2023-10-20 ENCOUNTER — Other Ambulatory Visit (HOSPITAL_COMMUNITY): Payer: Self-pay

## 2023-10-20 ENCOUNTER — Other Ambulatory Visit: Payer: Self-pay

## 2023-10-25 ENCOUNTER — Other Ambulatory Visit: Payer: Self-pay

## 2023-11-14 ENCOUNTER — Other Ambulatory Visit (HOSPITAL_COMMUNITY): Payer: Self-pay

## 2023-11-14 MED ORDER — METHYLPHENIDATE HCL ER (OSM) 27 MG PO TBCR
27.0000 mg | EXTENDED_RELEASE_TABLET | ORAL | 0 refills | Status: AC
  Filled 2024-05-09: qty 30, 30d supply, fill #0

## 2023-11-14 MED ORDER — METHYLPHENIDATE HCL ER (OSM) 27 MG PO TBCR
27.0000 mg | EXTENDED_RELEASE_TABLET | ORAL | 0 refills | Status: AC
  Filled 2023-12-26: qty 30, 30d supply, fill #0

## 2023-11-14 MED ORDER — METHYLPHENIDATE HCL ER (OSM) 27 MG PO TBCR
27.0000 mg | EXTENDED_RELEASE_TABLET | ORAL | 0 refills | Status: AC
  Filled 2023-11-17: qty 30, 30d supply, fill #0

## 2023-11-17 ENCOUNTER — Other Ambulatory Visit: Payer: Self-pay

## 2023-11-17 ENCOUNTER — Other Ambulatory Visit (HOSPITAL_COMMUNITY): Payer: Self-pay

## 2023-12-26 ENCOUNTER — Other Ambulatory Visit (HOSPITAL_BASED_OUTPATIENT_CLINIC_OR_DEPARTMENT_OTHER): Payer: Self-pay

## 2023-12-26 ENCOUNTER — Other Ambulatory Visit (HOSPITAL_COMMUNITY): Payer: Self-pay

## 2024-02-16 DIAGNOSIS — H52222 Regular astigmatism, left eye: Secondary | ICD-10-CM | POA: Diagnosis not present

## 2024-02-16 DIAGNOSIS — H5213 Myopia, bilateral: Secondary | ICD-10-CM | POA: Diagnosis not present

## 2024-02-22 ENCOUNTER — Other Ambulatory Visit (HOSPITAL_BASED_OUTPATIENT_CLINIC_OR_DEPARTMENT_OTHER): Payer: Self-pay

## 2024-02-22 MED ORDER — LISDEXAMFETAMINE DIMESYLATE 20 MG PO CAPS: 20.0000 mg | ORAL_CAPSULE | Freq: Every morning | ORAL | 0 refills | Status: AC

## 2024-02-22 MED ORDER — LISDEXAMFETAMINE DIMESYLATE 20 MG PO CAPS
20.0000 mg | ORAL_CAPSULE | Freq: Every morning | ORAL | 0 refills | Status: AC
  Filled 2024-02-22: qty 30, 30d supply, fill #0

## 2024-03-14 ENCOUNTER — Other Ambulatory Visit (HOSPITAL_BASED_OUTPATIENT_CLINIC_OR_DEPARTMENT_OTHER): Payer: Self-pay

## 2024-04-02 ENCOUNTER — Other Ambulatory Visit (HOSPITAL_BASED_OUTPATIENT_CLINIC_OR_DEPARTMENT_OTHER): Payer: Self-pay

## 2024-04-02 MED ORDER — LISDEXAMFETAMINE DIMESYLATE 30 MG PO CAPS
30.0000 mg | ORAL_CAPSULE | ORAL | 0 refills | Status: AC
  Filled 2024-04-02: qty 30, 30d supply, fill #0

## 2024-05-09 ENCOUNTER — Other Ambulatory Visit (HOSPITAL_BASED_OUTPATIENT_CLINIC_OR_DEPARTMENT_OTHER): Payer: Self-pay

## 2024-05-11 ENCOUNTER — Other Ambulatory Visit (HOSPITAL_BASED_OUTPATIENT_CLINIC_OR_DEPARTMENT_OTHER): Payer: Self-pay

## 2024-05-11 MED ORDER — METHYLPHENIDATE HCL ER (OSM) 36 MG PO TBCR
36.0000 mg | EXTENDED_RELEASE_TABLET | Freq: Every morning | ORAL | 0 refills | Status: AC
  Filled 2024-05-11: qty 30, 30d supply, fill #0
